# Patient Record
Sex: Male | Born: 1960 | Race: White | Hispanic: No | Smoking: Never smoker
Health system: Southern US, Community
[De-identification: ages and names within clinical notes are randomized; demographics above are authoritative.]

## PROBLEM LIST (undated history)

## (undated) DIAGNOSIS — L0291 Cutaneous abscess, unspecified: Secondary | ICD-10-CM

## (undated) DIAGNOSIS — I10 Essential (primary) hypertension: Secondary | ICD-10-CM

## (undated) DIAGNOSIS — I739 Peripheral vascular disease, unspecified: Secondary | ICD-10-CM

## (undated) DIAGNOSIS — E785 Hyperlipidemia, unspecified: Secondary | ICD-10-CM

## (undated) DIAGNOSIS — E119 Type 2 diabetes mellitus without complications: Secondary | ICD-10-CM

## (undated) HISTORY — DX: Peripheral vascular disease, unspecified: I73.9

## (undated) HISTORY — DX: Essential (primary) hypertension: I10

## (undated) HISTORY — DX: Hyperlipidemia, unspecified: E78.5

---

## 2015-02-18 ENCOUNTER — Emergency Department (HOSPITAL_COMMUNITY)
Admission: EM | Admit: 2015-02-18 | Discharge: 2015-02-18 | Disposition: A | Discharge status: Home / Self Care | Payer: Self-pay | Attending: Emergency Medicine | Admitting: Emergency Medicine

## 2015-02-18 ENCOUNTER — Encounter (HOSPITAL_COMMUNITY): Payer: Self-pay | Admitting: Emergency Medicine

## 2015-02-18 DIAGNOSIS — E119 Type 2 diabetes mellitus without complications: Secondary | ICD-10-CM | POA: Insufficient documentation

## 2015-02-18 DIAGNOSIS — Z791 Long term (current) use of non-steroidal anti-inflammatories (NSAID): Secondary | ICD-10-CM | POA: Insufficient documentation

## 2015-02-18 DIAGNOSIS — L0231 Cutaneous abscess of buttock: Secondary | ICD-10-CM | POA: Insufficient documentation

## 2015-02-18 DIAGNOSIS — Z7982 Long term (current) use of aspirin: Secondary | ICD-10-CM | POA: Insufficient documentation

## 2015-02-18 DIAGNOSIS — Z79899 Other long term (current) drug therapy: Secondary | ICD-10-CM | POA: Insufficient documentation

## 2015-02-18 HISTORY — DX: Cutaneous abscess, unspecified: L02.91

## 2015-02-18 HISTORY — DX: Type 2 diabetes mellitus without complications: E11.9

## 2015-02-18 MED ORDER — HYDROCODONE-ACETAMINOPHEN 5-325 MG PO TABS: 1.0000 | ORAL_TABLET | ORAL | Status: DC | PRN

## 2015-02-18 MED ORDER — SULFAMETHOXAZOLE-TRIMETHOPRIM 800-160 MG PO TABS: 1.0000 | ORAL_TABLET | Freq: Two times a day (BID) | ORAL | Status: AC

## 2015-02-18 MED ORDER — LIDOCAINE HCL (PF) 1 % IJ SOLN
INTRAMUSCULAR | Status: AC
  Filled 2015-02-18: qty 10

## 2015-02-18 NOTE — ED Provider Notes (Signed)
CSN: [id number]     Arrival date & time 02/18/15  1005 History   First MD Initiated Contact with Patient 02/18/15 1028     Chief Complaint  Patient presents with  . Abscess     (Consider location/radiation/quality/duration/timing/severity/associated sxs/prior Treatment) Patient is a 54 y.o. male presenting with abscess. The history is provided by the patient.  Abscess Associated symptoms: no fever    Daniel Byrd is a 54 y.o. male presenting for evaluation of an abscess on his left buttock which has been present for the past week and slowly getting larger.  He has a history of frequent abscesses which usually resolve with warm compresses and time.  He has used boil eze additionally without any resolution of this infection.  He denies fevers, chills, nausea and other complaint.       Past Medical History  Diagnosis Date  . Abscess   . Diabetes mellitus without complication    History reviewed. No pertinent past surgical history. Family History  Problem Relation Age of Onset  . Stroke Mother   . Diabetes Mother   . Heart failure Father    History  Substance Use Topics  . Smoking status: Never Smoker   . Smokeless tobacco: Never Used  . Alcohol Use: No    Review of Systems  Constitutional: Negative for fever and chills.  Respiratory: Negative for shortness of breath and wheezing.   Skin: Positive for color change.  Neurological: Negative for numbness.      Allergies  Review of patient's allergies indicates no known allergies.  Home Medications   Prior to Admission medications   Medication Sig Start Date End Date Taking? Authorizing Provider  aspirin EC 81 MG tablet Take 81 mg by mouth daily.   Yes Historical Provider, MD  naproxen sodium (ANAPROX) 220 MG tablet Take 220 mg by mouth 2 (two) times daily with a meal.   Yes Historical Provider, MD  ranitidine (ZANTAC) 150 MG tablet Take 150 mg by mouth 2 (two) times daily.   Yes Historical Provider, MD   HYDROcodone-acetaminophen (NORCO/VICODIN) 5-325 MG per tablet Take 1 tablet by mouth every 4 (four) hours as needed. 02/18/15   Burgess AmorJulie Marlowe Cinquemani, PA-C  sulfamethoxazole-trimethoprim (BACTRIM DS,SEPTRA DS) 800-160 MG per tablet Take 1 tablet by mouth 2 (two) times daily. 02/18/15 02/25/15  Raynelle FanningJulie Ajwa Kimberley, PA-C   BP 140/90 mmHg  Pulse 90  Temp(Src) 98.5 F (36.9 C) (Oral)  Resp 14  Ht 5\' 9"  (1.753 m)  Wt 185 lb (83.915 kg)  BMI 27.31 kg/m2  SpO2 100% Physical Exam  Constitutional: He appears well-developed and well-nourished. No distress.  HENT:  Head: Normocephalic.  Neck: Neck supple.  Cardiovascular: Normal rate.   Pulmonary/Chest: Effort normal. He has no wheezes.  Musculoskeletal: Normal range of motion. He exhibits no edema.  Skin:  Indurated abscess with central dark appearing eschar left lower medial buttock.  No drainage, small hallow of surrounding erythema, no red streaking.  No fluctuance.  Multiple areas of scarring on buttocks from prior abscesses.    ED Course  Procedures (including critical care time)  INCISION AND DRAINAGE Performed by: Burgess AmorIDOL, Dailee Manalang Consent: Verbal consent obtained. Risks and benefits: risks, benefits and alternatives were discussed Type: abscess  Body area: left buttock  Anesthesia: local infiltration  Incision was made with a scalpel.  Local anesthetic: lidocaine 1% without epinephrine  Anesthetic total: 6 ml  Complexity: complex Blunt dissection to break up loculations  Drainage: purulent  Drainage amount: small amount, approx 0.5 cc  Packing material: none  Patient tolerance: Patient tolerated the procedure well with no immediate complications.    Labs Review Labs Reviewed  CULTURE, ROUTINE-ABSCESS    Imaging Review No results found.   EKG Interpretation None      MDM   Final diagnoses:  Abscess of buttock, left    Pt advised warm water /shower soaks, bactrim, hydrocodone prescribed.  Chlorhexidine wash recommended.   Abscess culture pending.  Referral given for establishing pcp, otherwise advised return here for recheck for any worsened sx.    Pt seen by Dr. Jodi Mourning prior to dc home.    Burgess Amor, PA-C 02/18/15 1528  Blane Ohara, MD 02/19/15 [phone]

## 2015-02-18 NOTE — Discharge Instructions (Signed)
Abscess Care After An abscess (also called a boil or furuncle) is an infected area that contains a collection of pus. Signs and symptoms of an abscess include pain, tenderness, redness, or hardness, or you may feel a moveable soft area under your skin. An abscess can occur anywhere in the body. The infection may spread to surrounding tissues causing cellulitis. A cut (incision) by the surgeon was made over your abscess and the pus was drained out. Gauze may have been packed into the space to provide a drain that will allow the cavity to heal from the inside outwards. The boil may be painful for 5 to 7 days. Most people with a boil do not have high fevers. Your abscess, if seen early, may not have localized, and may not have been lanced. If not, another appointment may be required for this if it does not get better on its own or with medications. HOME CARE INSTRUCTIONS   Only take over-the-counter or prescription medicines for pain, discomfort, or fever as directed by your caregiver.  Soak in a warm tub of epsom salt water twice daily for 15-20 minutes.  Warm shower with directed water flow using your handheld shower head as discussed will also be therapeutic and help this heal quicker.  Use mild soap and water wash to the site.  Consider the chlorhexidine wash as recommended.  Do not drive within 4 hours of taking the pain medicine as it will make you sleepy.  Take your entire course of antibiotic. SEEK IMMEDIATE MEDICAL CARE IF:   You develop increased pain, swelling, redness, drainage, or bleeding in the wound site.  You develop signs of generalized infection including muscle aches, chills, fever, or a general ill feeling.  An oral temperature above 102 F (38.9 C) develops, not controlled by medication. See your caregiver for a recheck if you develop any of the symptoms described above. If medications (antibiotics) were prescribed, take them as directed. Document Released: 05/03/2005 Document  Revised: 01/07/2012 Document Reviewed: 12/29/2007 [facility] Patient Information 2015 [place], [place]. This information is not intended to replace advice given to you by your health care provider. Make sure you discuss any questions you have with your health care provider.

## 2015-02-18 NOTE — ED Notes (Signed)
Pt reports boil to L buttock, onset 1 week ago.

## 2015-02-18 NOTE — ED Notes (Signed)
Pt made aware to return if symptoms worsen or if any life threatening symptoms occur.   

## 2015-02-21 ENCOUNTER — Telehealth (HOSPITAL_BASED_OUTPATIENT_CLINIC_OR_DEPARTMENT_OTHER): Payer: Self-pay | Admitting: Emergency Medicine

## 2015-02-21 LAB — CULTURE, ROUTINE-ABSCESS: GRAM STAIN: NONE SEEN

## 2015-02-22 ENCOUNTER — Telehealth (HOSPITAL_COMMUNITY): Payer: Self-pay | Admitting: *Deleted

## 2015-03-09 ENCOUNTER — Telehealth (HOSPITAL_BASED_OUTPATIENT_CLINIC_OR_DEPARTMENT_OTHER): Payer: Self-pay | Admitting: Emergency Medicine

## 2015-03-09 NOTE — Telephone Encounter (Signed)
Lost to followup 

## 2016-07-05 ENCOUNTER — Emergency Department (HOSPITAL_COMMUNITY)
Admission: EM | Admit: 2016-07-05 | Discharge: 2016-07-05 | Disposition: A | Discharge status: Home / Self Care | Payer: Self-pay | Attending: Emergency Medicine | Admitting: Emergency Medicine

## 2016-07-05 ENCOUNTER — Encounter (HOSPITAL_COMMUNITY): Payer: Self-pay | Admitting: Emergency Medicine

## 2016-07-05 ENCOUNTER — Emergency Department (HOSPITAL_COMMUNITY): Payer: Self-pay

## 2016-07-05 DIAGNOSIS — I8002 Phlebitis and thrombophlebitis of superficial vessels of left lower extremity: Secondary | ICD-10-CM | POA: Insufficient documentation

## 2016-07-05 DIAGNOSIS — E119 Type 2 diabetes mellitus without complications: Secondary | ICD-10-CM | POA: Insufficient documentation

## 2016-07-05 DIAGNOSIS — Z7982 Long term (current) use of aspirin: Secondary | ICD-10-CM | POA: Insufficient documentation

## 2016-07-05 MED ORDER — CEPHALEXIN 500 MG PO CAPS: 500.0000 mg | ORAL_CAPSULE | Freq: Three times a day (TID) | ORAL | 0 refills | Status: DC

## 2016-07-05 MED ORDER — NAPROXEN 500 MG PO TABS: 500.0000 mg | ORAL_TABLET | Freq: Two times a day (BID) | ORAL | 0 refills | Status: DC

## 2016-07-05 NOTE — Discharge Instructions (Signed)
Elevate your leg when possible.  Warm compresses on/off.  Contact the clinic listed to establish primary care.  Return to ER for any worsening symptoms

## 2016-07-05 NOTE — ED Triage Notes (Signed)
PT c/o painful area to left inner mid-leg starting yesterday with redness traveling upward and hot to touch. PT denies any fevers at this time.

## 2016-07-05 NOTE — ED Provider Notes (Signed)
[facility]-EMERGENCY DEPT Provider Note   CSN: [id number] Arrival date & time: 07/05/16  1448     History   Chief Complaint Chief Complaint  Patient presents with  . Cellulitis    HPI Daniel Byrd is a 55 y.o. male.  HPI   Daniel Byrd is a 55 y.o. male who presents to the Emergency Department complaining of pain, redness to the medial left leg.  Noticed symptoms yesterday. Developed a "red streak" earlier today.  Pain is worse with palpation and weight bearing.  He denies known injury, insect bite or wound to the leg, chest pain, shortness of breath.  Hx of DM but not currently taking medications.  No hx of PE. DVT   Past Medical History:  Diagnosis Date  . Abscess   . Diabetes mellitus without complication (HCC)     There are no active problems to display for this patient.   History reviewed. No pertinent surgical history.     Home Medications    Prior to Admission medications   Medication Sig Start Date End Date Taking? Authorizing Provider  aspirin EC 81 MG tablet Take 81 mg by mouth daily.    Historical Provider, MD  HYDROcodone-acetaminophen (NORCO/VICODIN) 5-325 MG per tablet Take 1 tablet by mouth every 4 (four) hours as needed. 02/18/15   Burgess Amor, PA-C  naproxen sodium (ANAPROX) 220 MG tablet Take 220 mg by mouth 2 (two) times daily with a meal.    Historical Provider, MD  ranitidine (ZANTAC) 150 MG tablet Take 150 mg by mouth 2 (two) times daily.    Historical Provider, MD    Family History Family History  Problem Relation Age of Onset  . Stroke Mother   . Diabetes Mother   . Heart failure Father     Social History Social History  Substance Use Topics  . Smoking status: Never Smoker  . Smokeless tobacco: Never Used  . Alcohol use No     Allergies   Review of patient's allergies indicates no known allergies.   Review of Systems Review of Systems  Constitutional: Negative for activity change, appetite change, chills and fever.    Respiratory: Negative for chest tightness, shortness of breath and wheezing.   Cardiovascular: Negative for chest pain.  Gastrointestinal: Negative for abdominal pain, nausea and vomiting.  Genitourinary: Negative for dysuria.  Musculoskeletal: Positive for myalgias. Negative for arthralgias, back pain, joint swelling, neck pain and neck stiffness.  Skin: Positive for color change (redness medial left knee). Negative for rash and wound.  Neurological: Negative for dizziness, weakness, numbness and headaches.  Hematological: Does not bruise/bleed easily.  All other systems reviewed and are negative.    Physical Exam Updated Vital Signs BP 178/97 (BP Location: Left Arm)   Pulse 77   Temp 99.2 F (37.3 C) (Oral)   Resp 19   Ht 5\' 9"  (1.753 m)   Wt 79.4 kg   SpO2 100%   BMI 25.84 kg/m   Physical Exam  Constitutional: He is oriented to person, place, and time. He appears well-developed and well-nourished. No distress.  Cardiovascular: Normal rate, regular rhythm and intact distal pulses.   Pulmonary/Chest: Effort normal and breath sounds normal. No respiratory distress.  Musculoskeletal: Normal range of motion. He exhibits tenderness.       Left knee: He exhibits erythema. He exhibits normal range of motion.       Legs: Focal area of erythema to the skin of the medial left knee.  Palpable cord present extending to the  medial thigh.  DP pulses brisk, CR< 2 sec  Neurological: He is alert and oriented to person, place, and time.  Skin: Skin is warm and dry.  Psychiatric: He has a normal mood and affect.  Nursing note and vitals reviewed.    ED Treatments / Results  Labs (all labs ordered are listed, but only abnormal results are displayed) Labs Reviewed - No data to display  EKG  EKG Interpretation None       Radiology [place] Venous Img Lower Unilateral Left  Result Date: 07/05/2016 CLINICAL DATA:  Left lower extremity pain and edema. History of smoking. Evaluate for DVT  EXAM: LEFT LOWER EXTREMITY VENOUS DOPPLER ULTRASOUND TECHNIQUE: Gray-scale sonography with graded compression, as well as color Doppler and duplex ultrasound were performed to evaluate the lower extremity deep venous systems from the level of the common femoral vein and including the common femoral, femoral, profunda femoral, popliteal and calf veins including the posterior tibial, peroneal and gastrocnemius veins when visible. The superficial great saphenous vein was also interrogated. Spectral Doppler was utilized to evaluate flow at rest and with distal augmentation maneuvers in the common femoral, femoral and popliteal veins. COMPARISON:  None. FINDINGS: Contralateral Common Femoral Vein: Respiratory phasicity is normal and symmetric with the symptomatic side. No evidence of thrombus. Normal compressibility. Common Femoral Vein: No evidence of thrombus. Normal compressibility, respiratory phasicity and response to augmentation. Saphenofemoral Junction: No evidence of thrombus. Normal compressibility and flow on color Doppler imaging. Profunda Femoral Vein: No evidence of thrombus. Normal compressibility and flow on color Doppler imaging. Femoral Vein: No evidence of thrombus. Normal compressibility, respiratory phasicity and response to augmentation. Popliteal Vein: No evidence of thrombus. Normal compressibility, respiratory phasicity and response to augmentation. Calf Veins: No evidence of thrombus. Normal compressibility and flow on color Doppler imaging. Superficial Great Saphenous Vein: No evidence of thrombus. Normal compressibility and flow on color Doppler imaging. Venous Reflux:  None. Other Findings:  None. IMPRESSION: No evidence of DVT within the left lower extremity. Electronically Signed   By: Simonne ComeJohn  Watts M.D.   On: 07/05/2016 17:34    Procedures Procedures (including critical care time)  Medications Ordered in ED Medications - No data to display   Initial Impression / Assessment and Plan  / ED Course  I have reviewed the triage vital signs and the nursing notes.  Pertinent labs & imaging results that were available during my care of the patient were reviewed by me and considered in my medical decision making (see chart for details).  Clinical Course   1600  Pt also seen by Dr. Adriana Simasook.  Care plan discussed.    Sx's appear c/w superficial thrombophlebitis.  NV intact.    S neg for DVT.  Will treat with keflex and NSAID.  Strict return precautions given.  Pt is ambulatory with steady gait.   Final Clinical Impressions(s) / ED Diagnoses   Final diagnoses:  Superficial phlebitis and thrombophlebitis of left leg    New Prescriptions New Prescriptions   No medications on file     Rosey Bathammy Saleha Kalp, PA-C 07/06/16 2332    Donnetta HutchingBrian Cook, MD 07/09/16 [phone]

## 2020-12-21 ENCOUNTER — Encounter (HOSPITAL_COMMUNITY): Payer: Self-pay

## 2020-12-21 ENCOUNTER — Emergency Department (HOSPITAL_COMMUNITY): Payer: Medicare Other

## 2020-12-21 ENCOUNTER — Emergency Department (HOSPITAL_COMMUNITY)
Admission: EM | Admit: 2020-12-21 | Discharge: 2020-12-22 | Disposition: A | Discharge status: Home / Self Care | Payer: Medicare Other | Attending: Emergency Medicine | Admitting: Emergency Medicine

## 2020-12-21 ENCOUNTER — Other Ambulatory Visit: Payer: Self-pay

## 2020-12-21 DIAGNOSIS — E119 Type 2 diabetes mellitus without complications: Secondary | ICD-10-CM | POA: Insufficient documentation

## 2020-12-21 DIAGNOSIS — R109 Unspecified abdominal pain: Secondary | ICD-10-CM | POA: Diagnosis not present

## 2020-12-21 DIAGNOSIS — I10 Essential (primary) hypertension: Secondary | ICD-10-CM | POA: Diagnosis present

## 2020-12-21 LAB — CBC WITH DIFFERENTIAL/PLATELET
Abs Immature Granulocytes: 0.09 10*3/uL — ABNORMAL HIGH (ref 0.00–0.07)
Basophils Absolute: 0.1 10*3/uL (ref 0.0–0.1)
Basophils Relative: 1 %
Eosinophils Absolute: 0.1 10*3/uL (ref 0.0–0.5)
Eosinophils Relative: 1 %
HCT: 49 % (ref 39.0–52.0)
Hemoglobin: 17 g/dL (ref 13.0–17.0)
Immature Granulocytes: 1 %
Lymphocytes Relative: 21 %
Lymphs Abs: 3.9 10*3/uL (ref 0.7–4.0)
MCH: 30.2 pg (ref 26.0–34.0)
MCHC: 34.7 g/dL (ref 30.0–36.0)
MCV: 87.2 fL (ref 80.0–100.0)
Monocytes Absolute: 0.9 10*3/uL (ref 0.1–1.0)
Monocytes Relative: 5 %
Neutro Abs: 13.1 10*3/uL — ABNORMAL HIGH (ref 1.7–7.7)
Neutrophils Relative %: 71 %
Platelets: 269 10*3/uL (ref 150–400)
RBC: 5.62 MIL/uL (ref 4.22–5.81)
RDW: 12.5 % (ref 11.5–15.5)
WBC: 18.1 10*3/uL — ABNORMAL HIGH (ref 4.0–10.5)
nRBC: 0 % (ref 0.0–0.2)

## 2020-12-21 LAB — COMPREHENSIVE METABOLIC PANEL
ALT: 14 U/L (ref 0–44)
AST: 14 U/L — ABNORMAL LOW (ref 15–41)
Albumin: 4.3 g/dL (ref 3.5–5.0)
Alkaline Phosphatase: 67 U/L (ref 38–126)
Anion gap: 11 (ref 5–15)
BUN: 16 mg/dL (ref 6–20)
CO2: 25 mmol/L (ref 22–32)
Calcium: 9.6 mg/dL (ref 8.9–10.3)
Chloride: 99 mmol/L (ref 98–111)
Creatinine, Ser: 0.88 mg/dL (ref 0.61–1.24)
GFR, Estimated: >60 mL/min (ref >60)
Glucose, Bld: 277 mg/dL — ABNORMAL HIGH (ref 70–99)
Potassium: 3.8 mmol/L (ref 3.5–5.1)
Sodium: 135 mmol/L (ref 135–145)
Total Bilirubin: 0.5 mg/dL (ref 0.3–1.2)
Total Protein: 8.5 g/dL — ABNORMAL HIGH (ref 6.5–8.1)

## 2020-12-21 LAB — TROPONIN I (HIGH SENSITIVITY): Troponin I (High Sensitivity): 5 ng/L (ref <18)

## 2020-12-21 MED ORDER — HYDRALAZINE HCL 10 MG PO TABS
10.0000 mg | ORAL_TABLET | Freq: Once | ORAL | Status: AC
  Administered 2020-12-21: 10 mg via ORAL
  Filled 2020-12-21: qty 1

## 2020-12-21 MED ORDER — IOHEXOL 300 MG/ML  SOLN
100.0000 mL | Freq: Once | INTRAMUSCULAR | Status: AC | PRN
  Administered 2020-12-22: 100 mL via INTRAVENOUS

## 2020-12-21 NOTE — ED Triage Notes (Signed)
Pt to er, pt states that he is here for htn, states that he checked his blood pressure at home and it was elevated.  Pt states that he also has some R shoulder pain, states that he has frequent neck and shoulder pain and stiffness.  Pt states that he hasn't been to a doctor in 20-30 years.

## 2020-12-21 NOTE — ED Provider Notes (Signed)
Care assumed from Dr. Wilkie Aye, patient presenting with elevated blood pressure, vague flank pain and elevated WBC.  He is pending second troponin and CT abdomen and pelvis.  He will need medication for his blood pressure.  Repeat troponin is normal.  CT of abdomen and pelvis shows no acute process.  It is noted that he is also hyperglycemic with glucose 277.  He will need medication for both his blood pressure and glucose.  He is discharged with prescriptions for hydrochlorothiazide and Metformin.  He states he is planning to become established with [facility].  He is to make an appointment to be seen as soon as possible.  Recommended he stay on a low-salt diet and monitor his blood pressure at home.  Results for orders placed or performed during the hospital encounter of 12/21/20  CBC with Differential  Result Value Ref Range   WBC 18.1 (H) 4.0 - 10.5 K/uL   RBC 5.62 4.22 - 5.81 MIL/uL   Hemoglobin 17.0 13.0 - 17.0 g/dL   HCT 69.6 78.9 - 38.1 %   MCV 87.2 80.0 - 100.0 fL   MCH 30.2 26.0 - 34.0 pg   MCHC 34.7 30.0 - 36.0 g/dL   RDW 01.7 51.0 - 25.8 %   Platelets 269 150 - 400 K/uL   nRBC 0.0 0.0 - 0.2 %   Neutrophils Relative % 71 %   Neutro Abs 13.1 (H) 1.7 - 7.7 K/uL   Lymphocytes Relative 21 %   Lymphs Abs 3.9 0.7 - 4.0 K/uL   Monocytes Relative 5 %   Monocytes Absolute 0.9 0.1 - 1.0 K/uL   Eosinophils Relative 1 %   Eosinophils Absolute 0.1 0.0 - 0.5 K/uL   Basophils Relative 1 %   Basophils Absolute 0.1 0.0 - 0.1 K/uL   Immature Granulocytes 1 %   Abs Immature Granulocytes 0.09 (H) 0.00 - 0.07 K/uL  Comprehensive metabolic panel  Result Value Ref Range   Sodium 135 135 - 145 mmol/L   Potassium 3.8 3.5 - 5.1 mmol/L   Chloride 99 98 - 111 mmol/L   CO2 25 22 - 32 mmol/L   Glucose, Bld 277 (H) 70 - 99 mg/dL   BUN 16 6 - 20 mg/dL   Creatinine, Ser 5.27 0.61 - 1.24 mg/dL   Calcium 9.6 8.9 - 78.2 mg/dL   Total Protein 8.5 (H) 6.5 - 8.1 g/dL   Albumin 4.3 3.5 - 5.0  g/dL   AST 14 (L) 15 - 41 U/L   ALT 14 0 - 44 U/L   Alkaline Phosphatase 67 38 - 126 U/L   Total Bilirubin 0.5 0.3 - 1.2 mg/dL   GFR, Estimated >42 >35 mL/min   Anion gap 11 5 - 15  Troponin I (High Sensitivity)  Result Value Ref Range   Troponin I (High Sensitivity) 5 <18 ng/L  Troponin I (High Sensitivity)  Result Value Ref Range   Troponin I (High Sensitivity) 5 <18 ng/L   CT Abdomen Pelvis W Contrast  Result Date: 12/22/2020 CLINICAL DATA:  Abdominal pain EXAM: CT ABDOMEN AND PELVIS WITH CONTRAST TECHNIQUE: Multidetector CT imaging of the abdomen and pelvis was performed using the standard protocol following bolus administration of intravenous contrast. CONTRAST:  OMNIPAQUE IOHEXOL 300 MG/ML  SOLN COMPARISON:  None. FINDINGS: LOWER CHEST: Normal. HEPATOBILIARY: Normal hepatic contours. No intra- or extrahepatic biliary dilatation. The gallbladder is normal. PANCREAS: Normal pancreas. No ductal dilatation or peripancreatic fluid collection. SPLEEN: Normal. ADRENALS/URINARY TRACT: The adrenal glands are  normal. No hydronephrosis, nephroureterolithiasis or solid renal mass. The urinary bladder is normal for degree of distention STOMACH/BOWEL: There is no hiatal hernia. Normal duodenal course and caliber. No small bowel dilatation or inflammation. No focal colonic abnormality. Normal appendix. VASCULAR/LYMPHATIC: There is calcific atherosclerosis of the abdominal aorta. No lymphadenopathy. REPRODUCTIVE: Enlarged prostate measures 6.3 cm in transverse dimension. MUSCULOSKELETAL. No bony spinal canal stenosis or focal osseous abnormality. OTHER: None. IMPRESSION: 1. No acute abnormality of the abdomen or pelvis. 2. Prostatomegaly. Aortic Atherosclerosis (ICD10-I70.0). Electronically Signed   By: Deatra Robinson M.D.   On: 12/22/2020 00:38   DG Chest Port 1 View  Result Date: 12/22/2020 CLINICAL DATA:  Hypertension. EXAM: PORTABLE CHEST 1 VIEW COMPARISON:  None. FINDINGS: The heart size and  mediastinal contours are within normal limits. Both lungs are clear. The visualized skeletal structures are unremarkable. IMPRESSION: No active disease. Electronically Signed   By: Aram Candela M.D.   On: 12/22/2020 00:17     Dione Booze, MD 12/22/20 [phone]

## 2020-12-21 NOTE — ED Provider Notes (Signed)
[facility] EMERGENCY DEPARTMENT Provider Note   CSN: [id number] Arrival date & time: 12/21/20  1757     History Chief Complaint  Patient presents with  . Hypertension    Daniel Byrd is a 60 y.o. male.  HPI   60 year old male presents the emergency department concern for high blood pressure.  Patient states for the past couple years he has had an elevated blood pressure usually with systolics around 1 60-1 70.  He refuses to go to a doctor because he is scared to.  He has never been appropriately treated for HTN.  Patient states today when he checked his blood pressure the top number was over 200 and he got scared which is what prompted him to come to the emergency department for evaluation.  He currently has no chest pain or shortness of breath.  He reveals that yesterday he felt like he had some right jaw and shoulder pain but this is currently absent.  He also reveals that he is been having right-sided abdominal/flank discomfort for many years, as it is an achy sore sensation, sometimes keeps him up at night due to pain.  He has no associated nausea/vomiting/diarrhea.  Past Medical History:  Diagnosis Date  . Abscess   . Diabetes mellitus without complication (HCC)     There are no problems to display for this patient.   History reviewed. No pertinent surgical history.     Family History  Problem Relation Age of Onset  . Stroke Mother   . Diabetes Mother   . Heart failure Father     Social History   Tobacco Use  . Smoking status: Never Smoker  . Smokeless tobacco: Never Used  Vaping Use  . Vaping Use: Never used  Substance Use Topics  . Alcohol use: No  . Drug use: Yes    Types: Marijuana    Home Medications Prior to Admission medications   Medication Sig Start Date End Date Taking? Authorizing Provider  cephALEXin (KEFLEX) 500 MG capsule Take 1 capsule (500 mg total) by mouth 3 (three) times daily. For 7 days 07/05/16   Pauline Aus, PA-C  Multiple  Vitamin (MULTIVITAMIN WITH MINERALS) TABS tablet Take 1 tablet by mouth daily.    [provider]  naproxen (NAPROSYN) 500 MG tablet Take 1 tablet (500 mg total) by mouth 2 (two) times daily with a meal. 07/05/16   Triplett, Tammy, PA-C    Allergies    Patient has no known allergies.  Review of Systems   Review of Systems  Constitutional: Negative for chills and fever.  HENT: Negative for congestion.   Eyes: Negative for visual disturbance.  Respiratory: Negative for shortness of breath.   Cardiovascular: Negative for chest pain.       + HTN  Gastrointestinal: Negative for abdominal pain, diarrhea and vomiting.  Genitourinary: Positive for flank pain. Negative for dysuria.  Musculoskeletal:       + Right shoulder and jaw pain  Skin: Negative for rash.  Neurological: Negative for headaches.    Physical Exam Updated Vital Signs BP (!) 177/102   Pulse 77   Temp 99.2 F (37.3 C) (Oral)   Resp 15   Ht 5\' 9"  (1.753 m)   Wt 83.6 kg   SpO2 100%   BMI 27.20 kg/m   Physical Exam Vitals and nursing note reviewed.  Constitutional:      Appearance: Normal appearance.  HENT:     Head: Normocephalic.     Mouth/Throat:  Mouth: Mucous membranes are moist.  Cardiovascular:     Rate and Rhythm: Normal rate.  Pulmonary:     Effort: Pulmonary effort is normal. No respiratory distress.  Abdominal:     Palpations: Abdomen is soft. There is no mass.     Tenderness: There is no abdominal tenderness. There is no guarding or rebound.     Hernia: No hernia is present.  Skin:    General: Skin is warm.  Neurological:     Mental Status: He is alert and oriented to person, place, and time. Mental status is at baseline.  Psychiatric:        Mood and Affect: Mood normal.     ED Results / Procedures / Treatments   Labs (all labs ordered are listed, but only abnormal results are displayed) Labs Reviewed  CBC WITH DIFFERENTIAL/PLATELET  COMPREHENSIVE METABOLIC PANEL  TROPONIN  I (HIGH SENSITIVITY)    EKG EKG Interpretation  Date/Time:  Wednesday December 21 2020 21:06:36 EST Ventricular Rate:  77 PR Interval:    QRS Duration: 143 QT Interval:  419 QTC Calculation: 475 R Axis:   70 Text Interpretation: Sinus rhythm Right bundle branch block NSR, RBBB, no previous for comparison Confirmed by Coralee Pesa [phone]) on 12/21/2020 9:25:04 PM   Radiology No results found.  Procedures Procedures   Medications Ordered in ED Medications  hydrALAZINE (APRESOLINE) tablet 10 mg (has no administration in time range)    ED Course  I have reviewed the triage vital signs and the nursing notes.  Pertinent labs & imaging results that were available during my care of the patient were reviewed by me and considered in my medical decision making (see chart for details).    MDM Rules/Calculators/A&P                          60 year old male presents the emergency department with HTN.  Arrival blood pressures had systolics over the 200s.  He has no active chest pain, shortness of breath or jaw pain.  EKG shows a right bundle branch block, no previous to compare to.  No shortness of breath/tachycardia/hypoxia in regards to a PE.  Plan to treat hypertension, evaluate with blood work and a chest x-ray as well as do a CAT scan of the abdomen, given his concerning flank pain to rule out something like a AAA. Patient signed out to Dr. Preston Fleeting pending results and re evaluation.  Final Clinical Impression(s) / ED Diagnoses Final diagnoses:  None    Rx / DC Orders ED Discharge Orders    None       Rozelle Logan, DO 12/21/20 2253

## 2020-12-22 LAB — TROPONIN I (HIGH SENSITIVITY): Troponin I (High Sensitivity): 5 ng/L (ref <18)

## 2020-12-22 MED ORDER — METFORMIN HCL 500 MG PO TABS
500.0000 mg | ORAL_TABLET | Freq: Once | ORAL | Status: AC
  Administered 2020-12-22: 500 mg via ORAL
  Filled 2020-12-22: qty 1

## 2020-12-22 MED ORDER — METFORMIN HCL 500 MG PO TABS: 500.0000 mg | ORAL_TABLET | Freq: Two times a day (BID) | ORAL | 0 refills | Status: DC

## 2020-12-22 MED ORDER — HYDROCHLOROTHIAZIDE 25 MG PO TABS: 25.0000 mg | ORAL_TABLET | Freq: Every day | ORAL | 0 refills | Status: DC

## 2020-12-22 NOTE — Discharge Instructions (Signed)
Please start taking your blood pressure every day.  Keep a record of it and take that record with you when you see your primary care provider.  Be aware, that it may take several weeks before you see the effects of the blood pressure medication.  Return if you are having any problems.

## 2021-02-17 ENCOUNTER — Ambulatory Visit: Payer: Medicare Other | Admitting: Urology

## 2021-06-15 ENCOUNTER — Other Ambulatory Visit: Payer: Self-pay

## 2021-06-15 ENCOUNTER — Emergency Department (HOSPITAL_COMMUNITY)
Admission: EM | Admit: 2021-06-15 | Discharge: 2021-06-15 | Disposition: A | Discharge status: Home / Self Care | Payer: Medicare Other

## 2021-07-25 ENCOUNTER — Inpatient Hospital Stay (HOSPITAL_COMMUNITY)
Admission: EM | Admit: 2021-07-25 | Discharge: 2021-07-28 | DRG: 253 | Disposition: A | Discharge status: Home / Self Care | Payer: Medicare Other | Attending: Internal Medicine | Admitting: Internal Medicine

## 2021-07-25 ENCOUNTER — Emergency Department (HOSPITAL_COMMUNITY): Payer: Medicare Other

## 2021-07-25 ENCOUNTER — Other Ambulatory Visit: Payer: Self-pay

## 2021-07-25 ENCOUNTER — Emergency Department (HOSPITAL_COMMUNITY)
Admit: 2021-07-25 | Discharge: 2021-07-25 | Disposition: A | Discharge status: Home / Self Care | Payer: Medicare Other | Attending: Emergency Medicine | Admitting: Emergency Medicine

## 2021-07-25 ENCOUNTER — Emergency Department (HOSPITAL_COMMUNITY): Admit: 2021-07-25 | Discharge: 2021-07-25 | Disposition: A | Discharge status: Home / Self Care | Payer: Medicare Other

## 2021-07-25 ENCOUNTER — Encounter (HOSPITAL_COMMUNITY): Payer: Self-pay | Admitting: *Deleted

## 2021-07-25 ENCOUNTER — Inpatient Hospital Stay (HOSPITAL_COMMUNITY): Admit: 2021-07-25 | Payer: Medicare Other

## 2021-07-25 DIAGNOSIS — R008 Other abnormalities of heart beat: Secondary | ICD-10-CM | POA: Diagnosis not present

## 2021-07-25 DIAGNOSIS — Z79899 Other long term (current) drug therapy: Secondary | ICD-10-CM

## 2021-07-25 DIAGNOSIS — I70221 Atherosclerosis of native arteries of extremities with rest pain, right leg: Secondary | ICD-10-CM | POA: Diagnosis present

## 2021-07-25 DIAGNOSIS — I739 Peripheral vascular disease, unspecified: Secondary | ICD-10-CM

## 2021-07-25 DIAGNOSIS — M79604 Pain in right leg: Secondary | ICD-10-CM

## 2021-07-25 DIAGNOSIS — R609 Edema, unspecified: Secondary | ICD-10-CM | POA: Diagnosis not present

## 2021-07-25 DIAGNOSIS — L0390 Cellulitis, unspecified: Secondary | ICD-10-CM | POA: Diagnosis not present

## 2021-07-25 DIAGNOSIS — I70229 Atherosclerosis of native arteries of extremities with rest pain, unspecified extremity: Secondary | ICD-10-CM | POA: Diagnosis not present

## 2021-07-25 DIAGNOSIS — Z8249 Family history of ischemic heart disease and other diseases of the circulatory system: Secondary | ICD-10-CM | POA: Diagnosis not present

## 2021-07-25 DIAGNOSIS — I4510 Unspecified right bundle-branch block: Secondary | ICD-10-CM | POA: Diagnosis present

## 2021-07-25 DIAGNOSIS — E1151 Type 2 diabetes mellitus with diabetic peripheral angiopathy without gangrene: Principal | ICD-10-CM | POA: Diagnosis present

## 2021-07-25 DIAGNOSIS — Z833 Family history of diabetes mellitus: Secondary | ICD-10-CM | POA: Diagnosis not present

## 2021-07-25 DIAGNOSIS — M86171 Other acute osteomyelitis, right ankle and foot: Secondary | ICD-10-CM

## 2021-07-25 DIAGNOSIS — Z7984 Long term (current) use of oral hypoglycemic drugs: Secondary | ICD-10-CM | POA: Diagnosis not present

## 2021-07-25 DIAGNOSIS — Z20822 Contact with and (suspected) exposure to covid-19: Secondary | ICD-10-CM | POA: Diagnosis present

## 2021-07-25 DIAGNOSIS — N179 Acute kidney failure, unspecified: Secondary | ICD-10-CM | POA: Diagnosis present

## 2021-07-25 DIAGNOSIS — L03115 Cellulitis of right lower limb: Secondary | ICD-10-CM | POA: Diagnosis present

## 2021-07-25 DIAGNOSIS — I10 Essential (primary) hypertension: Secondary | ICD-10-CM | POA: Diagnosis present

## 2021-07-25 DIAGNOSIS — Z823 Family history of stroke: Secondary | ICD-10-CM

## 2021-07-25 DIAGNOSIS — E119 Type 2 diabetes mellitus without complications: Secondary | ICD-10-CM

## 2021-07-25 LAB — CBC
HCT: 48.9 % (ref 39.0–52.0)
Hemoglobin: 16.6 g/dL (ref 13.0–17.0)
MCH: 30.6 pg (ref 26.0–34.0)
MCHC: 33.9 g/dL (ref 30.0–36.0)
MCV: 90.1 fL (ref 80.0–100.0)
Platelets: 201 10*3/uL (ref 150–400)
RBC: 5.43 MIL/uL (ref 4.22–5.81)
RDW: 13.3 % (ref 11.5–15.5)
WBC: 12.5 10*3/uL — ABNORMAL HIGH (ref 4.0–10.5)
nRBC: 0 % (ref 0.0–0.2)

## 2021-07-25 LAB — BASIC METABOLIC PANEL
Anion gap: 9 (ref 5–15)
BUN: 25 mg/dL — ABNORMAL HIGH (ref 8–23)
CO2: 24 mmol/L (ref 22–32)
Calcium: 9.5 mg/dL (ref 8.9–10.3)
Chloride: 106 mmol/L (ref 98–111)
Creatinine, Ser: 0.98 mg/dL (ref 0.61–1.24)
GFR, Estimated: >60 mL/min (ref >60)
Glucose, Bld: 277 mg/dL — ABNORMAL HIGH (ref 70–99)
Potassium: 4.4 mmol/L (ref 3.5–5.1)
Sodium: 139 mmol/L (ref 135–145)

## 2021-07-25 LAB — LIPID PANEL
Cholesterol: 201 mg/dL — ABNORMAL HIGH (ref 0–200)
HDL: 41 mg/dL (ref >40)
LDL Cholesterol: 146 mg/dL — ABNORMAL HIGH (ref 0–99)
Total CHOL/HDL Ratio: 4.9 RATIO
Triglycerides: 68 mg/dL (ref <150)
VLDL: 14 mg/dL (ref 0–40)

## 2021-07-25 LAB — TROPONIN I (HIGH SENSITIVITY)
Troponin I (High Sensitivity): 4 ng/L (ref <18)
Troponin I (High Sensitivity): 5 ng/L (ref <18)

## 2021-07-25 LAB — CBG MONITORING, ED: Glucose-Capillary: 122 mg/dL — ABNORMAL HIGH (ref 70–99)

## 2021-07-25 LAB — RESP PANEL BY RT-PCR (FLU A&B, COVID) ARPGX2
Influenza A by PCR: NEGATIVE
Influenza B by PCR: NEGATIVE
SARS Coronavirus 2 by RT PCR: NEGATIVE

## 2021-07-25 MED ORDER — ONDANSETRON HCL 4 MG/2ML IJ SOLN
4.0000 mg | Freq: Once | INTRAMUSCULAR | Status: AC
  Administered 2021-07-25: 4 mg via INTRAVENOUS
  Filled 2021-07-25: qty 2

## 2021-07-25 MED ORDER — OXYCODONE-ACETAMINOPHEN 5-325 MG PO TABS
1.0000 | ORAL_TABLET | ORAL | Status: DC | PRN
Start: 2021-07-25 — End: 2021-07-25

## 2021-07-25 MED ORDER — PIPERACILLIN-TAZOBACTAM 3.375 G IVPB 30 MIN
3.3750 g | Freq: Once | INTRAVENOUS | Status: AC
  Administered 2021-07-25: 3.375 g via INTRAVENOUS
  Filled 2021-07-25: qty 50

## 2021-07-25 MED ORDER — INSULIN ASPART 100 UNIT/ML IJ SOLN: 0.0000 [IU] | Freq: Every day | INTRAMUSCULAR | Status: DC

## 2021-07-25 MED ORDER — VANCOMYCIN HCL 1500 MG/300ML IV SOLN
1500.0000 mg | Freq: Once | INTRAVENOUS | Status: AC
  Administered 2021-07-25: 1500 mg via INTRAVENOUS
  Filled 2021-07-25: qty 300

## 2021-07-25 MED ORDER — SODIUM CHLORIDE 0.9 % IV SOLN
2.0000 g | Freq: Three times a day (TID) | INTRAVENOUS | Status: DC
  Administered 2021-07-26: 2 g via INTRAVENOUS
  Filled 2021-07-25: qty 2

## 2021-07-25 MED ORDER — HYDROMORPHONE HCL 1 MG/ML IJ SOLN: 1.0000 mg | INTRAMUSCULAR | Status: DC | PRN

## 2021-07-25 MED ORDER — VANCOMYCIN HCL IN DEXTROSE 1-5 GM/200ML-% IV SOLN
1000.0000 mg | Freq: Two times a day (BID) | INTRAVENOUS | Status: DC
  Administered 2021-07-26: 1000 mg via INTRAVENOUS
  Filled 2021-07-25 (×2): qty 200

## 2021-07-25 MED ORDER — OXYCODONE HCL 5 MG PO TABS: 5.0000 mg | ORAL_TABLET | ORAL | Status: DC | PRN

## 2021-07-25 MED ORDER — OXYCODONE HCL 5 MG PO TABS
5.0000 mg | ORAL_TABLET | ORAL | Status: DC | PRN
  Administered 2021-07-26 (×2): 5 mg via ORAL
  Filled 2021-07-25 (×2): qty 1

## 2021-07-25 MED ORDER — INSULIN ASPART 100 UNIT/ML IJ SOLN
0.0000 [IU] | Freq: Three times a day (TID) | INTRAMUSCULAR | Status: DC
  Administered 2021-07-26: 1 [IU] via SUBCUTANEOUS
  Administered 2021-07-26 – 2021-07-27 (×4): 3 [IU] via SUBCUTANEOUS
  Administered 2021-07-28: 11 [IU] via SUBCUTANEOUS
  Administered 2021-07-28: 2 [IU] via SUBCUTANEOUS

## 2021-07-25 MED ORDER — HYDROMORPHONE HCL 1 MG/ML IJ SOLN: 0.5000 mg | INTRAMUSCULAR | Status: DC | PRN

## 2021-07-25 MED ORDER — ACETAMINOPHEN 650 MG RE SUPP: 650.0000 mg | Freq: Four times a day (QID) | RECTAL | Status: DC | PRN

## 2021-07-25 MED ORDER — HYDROMORPHONE HCL 1 MG/ML IJ SOLN
0.5000 mg | INTRAMUSCULAR | Status: DC | PRN
  Administered 2021-07-26 – 2021-07-27 (×2): 0.5 mg via INTRAVENOUS
  Filled 2021-07-25 (×2): qty 1

## 2021-07-25 MED ORDER — ACETAMINOPHEN 500 MG PO TABS
1000.0000 mg | ORAL_TABLET | Freq: Three times a day (TID) | ORAL | Status: DC
  Administered 2021-07-26 – 2021-07-28 (×6): 1000 mg via ORAL
  Filled 2021-07-25 (×7): qty 2

## 2021-07-25 MED ORDER — MORPHINE SULFATE (PF) 4 MG/ML IV SOLN
4.0000 mg | Freq: Once | INTRAVENOUS | Status: AC
  Administered 2021-07-25: 4 mg via INTRAVENOUS
  Filled 2021-07-25: qty 1

## 2021-07-25 MED ORDER — ACETAMINOPHEN 325 MG PO TABS
650.0000 mg | ORAL_TABLET | Freq: Four times a day (QID) | ORAL | Status: DC | PRN
Start: 2021-07-25 — End: 2021-07-25

## 2021-07-25 MED ORDER — ACETAMINOPHEN 650 MG RE SUPP: 650.0000 mg | Freq: Three times a day (TID) | RECTAL | Status: DC

## 2021-07-25 MED ORDER — LISINOPRIL 10 MG PO TABS
10.0000 mg | ORAL_TABLET | Freq: Every day | ORAL | Status: DC
  Administered 2021-07-26 – 2021-07-28 (×3): 10 mg via ORAL
  Filled 2021-07-25 (×3): qty 1

## 2021-07-25 NOTE — Progress Notes (Addendum)
Pharmacy Antibiotic Note  Daniel Byrd is a 60 y.o. male admitted on 07/25/2021 presenting with leg pain, chronic vascular disease, concern for cellulitis Pharmacy has been consulted for vancomycin dosing.  Plan: Vancomycin 1500 mg IV x 1, then 1000 mg IV q 12h  (eAUC 502, Goal AUC 400-550, SCr 0.98) Cefepime 2g IV q8h Monitor renal function, clinical progression and ability to narrow Vancomycin levels as needed  Height: 5\' 9"  (175.3 cm) Weight: 78.9 kg (174 lb) IBW/kg (Calculated) : 70.7  Temp (24hrs), Avg:98.3 F (36.8 C), Min:98.2 F (36.8 C), Max:98.3 F (36.8 C)  Recent Labs  Lab 07/25/21 1506  WBC 12.5*  CREATININE 0.98    Estimated Creatinine Clearance: 79.2 mL/min (by C-G formula based on SCr of 0.98 mg/dL).    No Known Allergies  07/27/21, PharmD Clinical Pharmacist ED Pharmacist Phone # [phone] 07/25/2021 6:39 PM

## 2021-07-25 NOTE — ED Triage Notes (Signed)
LEFT LEG PAIN AND SWELLING . WORSE AFTER VASCULAR STUDY LAST WEEK, STATES HE WAS ADVISED TO SEE A VASCULAR SURGEON

## 2021-07-25 NOTE — ED Provider Notes (Addendum)
[facility] EMERGENCY DEPARTMENT Provider Note   CSN: [id number] Arrival date & time: 07/25/21  1348     History No chief complaint on file.   Daniel Byrd is a 60 y.o. male who presents emergency department with a chief complaint of right leg pain.  Patient states that he has been having symptoms of claudication in the right leg for the past year.  He states that last summer when he would mow the lawn he would get a severe cramp in his right calf that would be relieved with rest.  For the past 3 to 4 months he has had chronic, daily leg pain which he describes as severe.  He is attended by his sister-in-law today.  Over the past 4 days he has developed swelling in his foot.  They were seen at [facility] by cardiologist and had arterial ultrasounds done which showed the following results with start which are listed below the HPI.  They were concerned that this might need emergent intervention although they were given vascular follow-up which is not for a few more weeks.  Pvl Arterial Physiologic Complete Lower Extremity Bilateral (07/20/2021 2:18 PM EDT) Imaging Results - Pvl Arterial Physiologic Complete Lower Extremity Bilateral (07/20/2021 2:18 PM EDT) Anatomical Region Laterality Modality  Vascular   Ultrasound   07/20/2021 2:57 PM EDT   Right:  Resting ABI on the right non attainable.  The segmental Doppler at the right ankle demonstrates evidence of significant more proximal arterial occlusive disease, with abnormal monophasic dorsalis pedis waveform.  Left:  Resting ABI on the left noncompressible  Segmental Doppler at the left ankle demonstrates waveforms relatively maintained.  Signed,  Yvone Neu. Reyne Dumas, RPVI  [facility]  [facility] Radiology   Electronically Signed  By: Gilmer Mor D.O.  On: 07/20/2021 14:57      Imaging Results - Pvl Arterial Physiologic Complete Lower Extremity Bilateral (07/20/2021 2:18 PM  EDT) Narrative  07/20/2021 2:57 PM EDT   CLINICAL DATA:  60 year old male with claudication  EXAM: NONINVASIVE PHYSIOLOGIC VASCULAR STUDY OF BILATERAL LOWER EXTREMITIES  TECHNIQUE: Non-invasive vascular evaluation of both lower extremities was performed at rest, including calculation of ankle-brachial indices, multiple segmental pressure evaluation, segmental Doppler and segmental pulse volume recording.  COMPARISON:  None.  FINDINGS: Right:  Resting ankle brachial index:  Non attainable  TBI: 0.12  Doppler: Segmental Doppler at the right ankle demonstrates artifact of the posterior tibial artery with abnormal monophasic waveform of the dorsalis pedis.  Left:  Resting ankle brachial index: Noncompressible  TBI: 0.49  Doppler: Segmental Doppler at the left ankle demonstrates triphasic dorsalis pedis and biphasic posterior tibial  Additional:       HPI     Past Medical History:  Diagnosis Date   Abscess    Diabetes mellitus without complication (HCC)     There are no problems to display for this patient.   History reviewed. No pertinent surgical history.     Family History  Problem Relation Age of Onset   Stroke Mother    Diabetes Mother    Heart failure Father     Social History   Tobacco Use   Smoking status: Never   Smokeless tobacco: Never  Vaping Use   Vaping Use: Never used  Substance Use Topics   Alcohol use: No   Drug use: Yes    Types: Marijuana    Home Medications Prior to Admission medications   Medication Sig Start Date End Date Taking? Authorizing  Provider  cephALEXin (KEFLEX) 500 MG capsule Take 1 capsule (500 mg total) by mouth 3 (three) times daily. For 7 days 07/05/16   Triplett, Tammy, PA-C  hydrochlorothiazide (HYDRODIURIL) 25 MG tablet Take 1 tablet (25 mg total) by mouth daily. 12/22/20   Dione Booze, MD  metFORMIN (GLUCOPHAGE) 500 MG tablet Take 1 tablet (500 mg total) by mouth 2 (two) times daily with a meal.  12/22/20   Dione Booze, MD  Multiple Vitamin (MULTIVITAMIN WITH MINERALS) TABS tablet Take 1 tablet by mouth daily.    [provider]  naproxen (NAPROSYN) 500 MG tablet Take 1 tablet (500 mg total) by mouth 2 (two) times daily with a meal. 07/05/16   Triplett, Tammy, PA-C    Allergies    Patient has no known allergies.  Review of Systems   Review of Systems Ten systems reviewed and are negative for acute change, except as noted in the HPI.   Physical Exam Updated Vital Signs BP 129/81 (BP Location: Right Arm)   Pulse 83   Temp 98.2 F (36.8 C) (Oral)   Resp 15   Ht 5\' 9"  (1.753 m)   Wt 78.9 kg   SpO2 99%   BMI 25.70 kg/m   Physical Exam Vitals and nursing note reviewed.  Constitutional:      General: He is not in acute distress.    Appearance: He is well-developed. He is not diaphoretic.  HENT:     Head: Normocephalic and atraumatic.  Eyes:     General: No scleral icterus.    Conjunctiva/sclera: Conjunctivae normal.  Cardiovascular:     Rate and Rhythm: Normal rate and regular rhythm.     Pulses:          Dorsalis pedis pulses are 0 on the right side and 1+ on the left side.       Posterior tibial pulses are 0 on the right side and 1+ on the left side.     Heart sounds: Normal heart sounds.  Pulmonary:     Effort: Pulmonary effort is normal. No respiratory distress.     Breath sounds: Normal breath sounds.  Abdominal:     Palpations: Abdomen is soft.     Tenderness: There is no abdominal tenderness.  Musculoskeletal:     Cervical back: Normal range of motion and neck supple.     Right lower leg: 1+ Pitting Edema present.     Left lower leg: No edema.  Skin:    General: Skin is warm and dry.  Neurological:     Mental Status: He is alert.  Psychiatric:        Behavior: Behavior normal.    ED Results / Procedures / Treatments   Labs (all labs ordered are listed, but only abnormal results are displayed) Labs Reviewed  BASIC METABOLIC PANEL -  Abnormal; Notable for the following components:      Result Value   Glucose, Bld 277 (*)    BUN 25 (*)    All other components within normal limits  CBC - Abnormal; Notable for the following components:   WBC 12.5 (*)    All other components within normal limits  RESP PANEL BY RT-PCR (FLU A&B, COVID) ARPGX2  TROPONIN I (HIGH SENSITIVITY)  TROPONIN I (HIGH SENSITIVITY)    EKG EKG Interpretation  Date/Time:  Tuesday July 25 2021 14:37:01 EDT Ventricular Rate:  77 PR Interval:  188 QRS Duration: 122 QT Interval:  400 QTC Calculation: 452 R Axis:   77  Text Interpretation: Normal sinus rhythm Right bundle branch block Abnormal ECG No significant change since last tracing Confirmed by Linwood Dibbles [phone]) on 07/25/2021 3:02:19 PM  Radiology DG Chest 2 View  Result Date: 07/25/2021 CLINICAL DATA:  Right-sided chest pain EXAM: CHEST - 2 VIEW COMPARISON:  12/21/2020 FINDINGS: The heart size and mediastinal contours are within normal limits. Both lungs are clear. The visualized skeletal structures are unremarkable. IMPRESSION: No active cardiopulmonary disease. Electronically Signed   By: Sharlet Salina M.D.   On: 07/25/2021 16:03    Procedures Procedures   Medications Ordered in ED Medications  morphine 4 MG/ML injection 4 mg (4 mg Intravenous Given 07/25/21 1519)  ondansetron (ZOFRAN) injection 4 mg (4 mg Intravenous Given 07/25/21 1519)    ED Course  I have reviewed the triage vital signs and the nursing notes.  Pertinent labs & imaging results that were available during my care of the patient were reviewed by me and considered in my medical decision making (see chart for details).    MDM Rules/Calculators/A&P                           60 year old male here with right leg pain which appears to be chronic but progressively worsening.  He does not have pulses by palpation or Doppler in the right foot.  I discussed the case with Dr. Edilia Bo who states that the patient does not  need any further intervention with immediate imaging at this time and that if he wants to be evaluated he could come to the [facility] emergency department.  Dr. Edilia Bo suspects that this is just worsening of chronic vascular disease.  The patient has color to the foot which is not suggestive of acute arterial occlusion.  We reviewed the findings of recent vascular studies done at [facility] which suggests chronic multilevel arterial vessel disease on the right side.  Patient was informed of findings and has opted to go for further evaluation at [facility] emergency department.  Dr. Edilia Bo has asked that he be called upon the patient's arrival so that he may assess him.  I reviewed labs ordered in triage which shows elevated white blood cell count likely acute phase reaction, elevated blood glucose patient has a history of diabetes initial troponin within normal limits.  I reviewed images of a 2 view chest x-ray which shows no acute abnormalities.  EKG shows sinus rhythm with a right bundle branch block at a rate of 77. Final Clinical Impression(s) / ED Diagnoses Final diagnoses:  None    Rx / DC Orders ED Discharge Orders     None        Arthor Captain, PA-C 07/25/21 1658    Arthor Captain, PA-C 07/25/21 1659    Linwood Dibbles, MD 07/27/21 [phone]

## 2021-07-25 NOTE — ED Provider Notes (Signed)
Emergency Medicine Provider Triage Evaluation Note  Daniel Byrd , a 60 y.o. male  was evaluated in triage.  Pt complains of right leg pain.  He was sent over here by Daniel Byrd for DVT evaluation.  He has had worsening leg pain and leg swelling for the past 4 days.  He has redness associated. He has a history of peripheral artery disease.  He was seen at [facility] where he had arterial ultrasounds done and patient was given vascular follow-up.  He has not yet seen vascular yet. Per Daniel Byrd note, he does not have pulses by palpation or Doppler in the right foot.  Review of Systems  Positive: Unilateral leg swelling, leg pain, erythema Negative: Shortness of breath, chest pain, fever, chills  Physical Exam  BP (!) 169/92 (BP Location: Left Arm)   Pulse 72   Temp 98.3 F (36.8 C) (Oral)   Resp 16   Ht 5\' 9"  (1.753 m)   Wt 78.9 kg   SpO2 100%   BMI 25.70 kg/m  Gen:   Awake, no distress   Resp:  Normal effort  MSK:   Moves extremities without difficulty  Other:  Right leg with edema from foot up until knee.  I was unable to palpate pedal pulse.  Patient's foot is very warm to touch.  His capillary refill is less than 3 seconds.  He has several chronic ulcers that do not look infected on his right foot.  Medical Decision Making  Medically screening exam initiated at 5:45 PM.  Appropriate orders placed.  Daniel Byrd was informed that the remainder of the evaluation will be completed by another provider, this initial triage assessment does not replace that evaluation, and the importance of remaining in the ED until their evaluation is complete.  I reviewed the note from [facility]  Dr. [facility] who evaluated the patient felt that the lack of pulse was not in arterial occlusion since patient had color to the foot that was warm.  Feels like this is worsening of chronic vascular disease.  I agree with this suggestion.  We can try to obtain Dopplers once patient is in the back.    Daniel Nora, PA-C 07/25/21 1751    07/27/21, DO 07/25/21 2149

## 2021-07-25 NOTE — Consult Note (Addendum)
ASSESSMENT & PLAN   PERIPHERAL ARTERIAL DISEASE: This patient has evidence of infrainguinal arterial occlusive disease on the right with a nonhealing wound on the right third toe and progressive ischemia with rest pain.  This is clearly a limb threatening situation and I have recommended that we proceed with arteriography.  I have scheduled this for tomorrow. I have reviewed with the patient the indications for arteriography. In addition, I have reviewed the potential complications of arteriography including but not limited to: Bleeding, arterial injury, arterial thrombosis, dye action, renal insufficiency, or other unpredictable medical problems. I have explained to the patient that if we find disease amenable to angioplasty we could potentially address this at the same time. I have discussed the potential complications of angioplasty and stenting, including but not limited to: Bleeding, arterial thrombosis, arterial injury, dissection, or the need for surgical intervention.  RIGHT LEG SWELLING: His right leg swelling is likely related to the wound on his foot.  ADDENDUM: His venous duplex scan shows no evidence of DVT.  DIABETES: His CBGs are elevated likely related to his infection.  CHEST PAIN: He had some chest pain earlier today but this sounds musculoskeletal and he did not describe any chest pressure or significant shortness of breath.  Covid pend  REASON FOR CONSULT:    Right leg pain.  The consult is requested by the [facility] emergency department.  HPI:   Daniel Byrd is a 60 y.o. male who presents with progressive ischemia of the right lower extremity.  He has had right calf claudication for a year.  This was gradual in onset.  He experienced this while cutting the grass.  This involved his right calf only and did not involve his thigh or hip.  He denies any symptoms in the left leg.  Over the last several months he developed a gradual onset of rest pain in the right foot.  He  also states that he has had a wound on his right third toe for many months.  Recently the foot was becoming more swollen and he had some redness.  He was told by his primary care physician to go to the emergency department.  He went to the emergency department at [facility].  Of note he had noninvasive studies done at the outlying office which showed markedly diminished flow in the right foot.  His risk factors for peripheral vascular disease include type 2 diabetes and hypertension.  He denies any history of hypercholesterolemia, family history of premature cardiovascular disease, or tobacco use.  He had some chest pain earlier this morning which appears to be related to using his walker which he was using because of his right foot pain.  He does not describe any chest pressure or significant dyspnea on exertion.  Past Medical History:  Diagnosis Date   Abscess    Diabetes mellitus without complication (HCC)     Family History  Problem Relation Age of Onset   Stroke Mother    Diabetes Mother    Heart failure Father     SOCIAL HISTORY: He is widowed. Social History   Tobacco Use   Smoking status: Never   Smokeless tobacco: Never  Substance Use Topics   Alcohol use: No    No Known Allergies  No current facility-administered medications for this encounter.   Current Outpatient Medications  Medication Sig Dispense Refill   cephALEXin (KEFLEX) 500 MG capsule Take 1 capsule (500 mg total) by mouth 3 (three) times daily. For 7 days  21 capsule 0   hydrochlorothiazide (HYDRODIURIL) 25 MG tablet Take 1 tablet (25 mg total) by mouth daily. 30 tablet 0   metFORMIN (GLUCOPHAGE) 500 MG tablet Take 1 tablet (500 mg total) by mouth 2 (two) times daily with a meal. 60 tablet 0   Multiple Vitamin (MULTIVITAMIN WITH MINERALS) TABS tablet Take 1 tablet by mouth daily.     naproxen (NAPROSYN) 500 MG tablet Take 1 tablet (500 mg total) by mouth 2 (two) times daily with a meal. 20 tablet 0     REVIEW OF SYSTEMS:  [X]  denotes positive finding, [ ]  denotes negative finding Cardiac  Comments:  Chest pain or chest pressure:    Shortness of breath upon exertion:    Short of breath when lying flat:    Irregular heart rhythm:        Vascular    Pain in calf, thigh, or hip brought on by ambulation: x Right calf  Pain in feet at night that wakes you up from your sleep:  x Right foot  Blood clot in your veins:    Leg swelling:  x right      Pulmonary    Oxygen at home:    Productive cough:     Wheezing:         Neurologic    Sudden weakness in arms or legs:     Sudden numbness in arms or legs:     Sudden onset of difficulty speaking or slurred speech:    Temporary loss of vision in one eye:     Problems with dizziness:         Gastrointestinal    Blood in stool:     Vomited blood:         Genitourinary    Burning when urinating:     Blood in urine:        Psychiatric    Major depression:         Hematologic    Bleeding problems:    Problems with blood clotting too easily:        Skin    Rashes or ulcers: x Right third toe      Constitutional    Fever or chills:    -  PHYSICAL EXAM:   Vitals:   07/25/21 1429 07/25/21 1643 07/25/21 1730  BP: 138/85 129/81 (!) 169/92  Pulse: 83  72  Resp: 18 15 16   Temp: 98.2 F (36.8 C)  98.3 F (36.8 C)  TempSrc: Oral  Oral  SpO2: 98% 99% 100%  Weight: 78.9 kg    Height: 5\' 9"  (1.753 m)     Body mass index is 25.7 kg/m. GENERAL: The patient is a well-nourished male, in no acute distress. The vital signs are documented above. CARDIAC: There is a regular rate and rhythm.  VASCULAR: I do not detect carotid bruits. On the right side, which is the side of concern, there is a palpable femoral pulse.  I cannot palpate a popliteal or pedal pulses.  He has a dampened monophasic anterior tibial signal with the Doppler only. On the left side he has a palpable femoral, popliteal, and dorsalis pedis pulse. The right foot  is swollen. There is mild erythema. PULMONARY: There is good air exchange bilaterally without wheezing or rales. ABDOMEN: Soft and non-tender with normal pitched bowel sounds.  MUSCULOSKELETAL: There are no major deformities. NEUROLOGIC: No focal weakness or paresthesias are detected. SKIN: He has a wound on the right third toe and also  on the dorsum of the foot as documented in the photograph below.   PSYCHIATRIC: The patient has a normal affect.  DATA:    LABS: His GFR is greater than 60.  Creatinine 0.98.  White blood cell count 12.5.  Hemoglobin 16.6.  Platelets 201,000.  COVID  NEGATIVE  ARTERIAL DOPPLER STUDY: I reviewed the arterial Doppler the study that was done on 07/20/2021.    At that time on the right, he had a dampened monophasic dorsalis pedis signal with the Doppler.  An ABI could not be obtained.  On the left side an ABI could not be obtained because the arteries were noncompressible.     Waverly Ferrari [facility]

## 2021-07-25 NOTE — ED Notes (Signed)
Pt here POV from [facility], here for vascular.

## 2021-07-25 NOTE — Hospital Course (Addendum)
Plan: I will plan to see the patient in 2 weeks for wound care follow-up, new ABIs. Current antiplatelet regimen is dual antiplatelet therapy.  I would like him to transitioned to Xarelto and single antiplatelet (aspirin) this evening, as the lesions in the right leg appeared to be thrombus rather than purely atherosclerotic.   Patient does not have a history of A. fib, but would benefit from Holter monitor.

## 2021-07-25 NOTE — H&P (Addendum)
Date: 07/25/2021               Patient Name:  Daniel Byrd MRN: [id number]  DOB: Mar 16, 1961 Age / Sex: 60 y.o., male   PCP: Royann Shivers, PA-C         Medical Service: Internal Medicine Teaching Service         Attending Physician: Dr. Earl Lagos, MD    First Contact: Dr. Cyndie Chime Pager: [phone]  Second Contact: Dr. Marijo Conception Pager: [phone]       After Hours (After 5p/  First Contact Pager: [phone]  weekends / holidays): Second Contact Pager: [phone]   Chief Complaint: right foot pain  History of Present Illness:   [name] is a 60 year old male with history of type 2 diabetes, hypertension presenting with right foot pain.  States that his right foot has been hurting for the past 2 to 3 months with gradual worsening.  Initially he thought it was back pain as he was having knee pain that radiated to his foot, went to a chiropractor who performed imaging and noted he had 2 discs touching next back. He subsequently went to a Careers adviser, who started him on gabapentin on September 7. Notes that after starting gabapentin, he began noticing isolated right foot swelling and pain.  This past Thursday, he got a blood flow test done and was told he had no blood flow to his right foot.  Also mentions that his pain became a lot worse on Sunday.  He has been putting ice on his foot but notes continued sensation of burning.  Also mentions that he has been taking Xanax 5 mg for sleep and pain relief.  Does note that he had right leg pain about a year ago with strenuous activity, such as mowing his lawn.  Pain would improve with rest.  Never had pain in left leg.  No fevers, chills, N/V, changes in bowel movements, changes in urination.  Notes that he does not want to lose his right foot, but if that is the only other option then he is willing to do so.  He is worried about how he will take care of himself if he were to get an amputation.  States that his blood sugars typically  range in the 100s to 130s at home.  He has been trying to control his diet. He used to be on metformin, but this was stopped due to GI issues. He currently only takes [place].  He follows Dr. Leandrew Koyanagi and PA Vernice Jefferson at [facility].   PMHx: -T2DM -HTN  Meds:  -Lisinopril (unsure dosage) -Jardiance (unsure dosage)  Allergies: Allergies as of 07/25/2021   (No Known Allergies)   Family History:  Mother and older brother with diabetes Older brother passed at 42 (after open heart surgery) Mother passed from CVA Dad had heart problems, needed 4 bypasses.   Social History:  Lives by himself and has a Nurse, mental health. Independent in ADLs. He is on disability, used to work as a Curator. Sister-in-law, Beth, helps with taking him to doctor visits (patient reports low literacy). Never smoked cigarettes. Does not consume alcohol. Smokes marijuana occasionally (once a week).  Review of Systems: A complete ROS was negative except as per HPI.   Physical Exam: Blood pressure (!) 166/95, pulse 70, temperature 98.3 F (36.8 C), temperature source Oral, resp. rate 17, height 5\' 9"  (1.753 m), weight 78.9 kg, SpO2 100 %. Physical Exam Constitutional:      Appearance: Normal appearance.  He is not ill-appearing.  HENT:     Head: Normocephalic and atraumatic.     Mouth/Throat:     Mouth: Mucous membranes are moist.     Pharynx: Oropharynx is clear. No oropharyngeal exudate.  Eyes:     Extraocular Movements: Extraocular movements intact.     Conjunctiva/sclera: Conjunctivae normal.     Pupils: Pupils are equal, round, and reactive to light.  Cardiovascular:     Rate and Rhythm: Normal rate and regular rhythm.     Heart sounds: Normal heart sounds. No murmur heard.   No friction rub. No gallop.     Comments: 1+ DP pulse on left foot, unable to palpate on right foot. Pulmonary:     Effort: Pulmonary effort is normal. No respiratory distress.     Breath sounds: Normal breath sounds. No  wheezing or rhonchi.  Abdominal:     General: Bowel sounds are normal. There is no distension.     Palpations: Abdomen is soft.     Tenderness: There is no abdominal tenderness. There is no guarding or rebound.  Musculoskeletal:     Cervical back: Normal range of motion and neck supple.     Comments: ROM with full range of motion in left foot. ROM limited in right foot due to tenderness. Nonpitting edema in right foot.   Skin:    Comments: Erythema noted on distal right leg (below knee). Right foot cooler to touch than left. Several skin lesions tracking along right leg with eschar formation, including one on third digit.  Neurological:     General: No focal deficit present.     Mental Status: He is alert and oriented to person, place, and time.  Psychiatric:        Mood and Affect: Mood normal.        Behavior: Behavior normal.        Thought Content: Thought content normal.      EKG: personally reviewed my interpretation is NSR, RBBB (similar to prior).   DG Chest 2 View  Result Date: 07/25/2021 CLINICAL DATA:  Right-sided chest pain EXAM: CHEST - 2 VIEW COMPARISON:  12/21/2020 FINDINGS: The heart size and mediastinal contours are within normal limits. Both lungs are clear. The visualized skeletal structures are unremarkable. IMPRESSION: No active cardiopulmonary disease. Electronically Signed   By: Sharlet Salina M.D.   On: 07/25/2021 16:03   VAS [place] LOWER EXTREMITY ARTERIAL DUPLEX (ONLY [facility] & [facility] 7a-7p)  Result Date: 07/25/2021 LOWER EXTREMITY ARTERIAL DUPLEX STUDY Patient Name:  Daniel Byrd  Date of Exam:   07/25/2021 Medical Rec #: [id number]       Accession #:    [id number] Date of Birth: 03/02/1961       Patient Gender: M Patient Age:   41 years Exam Location:  [facility] Procedure:      VAS [place] LOWER EXTREMITY ARTERIAL DUPLEX Referring Phys: GRACE LOEFFLER --------------------------------------------------------------------------------  Indications: Rest pain, ulceration,  and peripheral artery disease. High Risk Factors: Diabetes.  Current ABI: Right= unable to be obtained, left= noncompressible Comparison Study: No prior study Performing Technologist: Gertie Fey MHA, RDMS, RVT, RDCS  Examination Guidelines: A complete evaluation includes B-mode imaging, spectral Doppler, color Doppler, and power Doppler as needed of all accessible portions of each vessel. Bilateral testing is considered an integral part of a complete examination. Limited examinations for reoccurring indications may be performed as noted.  +-----------+--------+-----+---------------+------------------+----------------+ RIGHT      PSV cm/sRatioStenosis       Waveform  Comments         +-----------+--------+-----+---------------+------------------+----------------+ CFA Distal 159                         triphasic                          +-----------+--------+-----+---------------+------------------+----------------+ DFA        133                         triphasic                          +-----------+--------+-----+---------------+------------------+----------------+ SFA Prox   185          30-49% stenosismonophasic        Occluded just                                                             distal to this                                                            sample           +-----------+--------+-----+---------------+------------------+----------------+ SFA Mid    441          75-99% stenosismonophasic                         +-----------+--------+-----+---------------+------------------+----------------+ SFA Distal 62                          monophasic                         +-----------+--------+-----+---------------+------------------+----------------+ POP Prox   29                          Severely damepened                                                        monophasic                          +-----------+--------+-----+---------------+------------------+----------------+ POP Distal 52                          monophasic                         +-----------+--------+-----+---------------+------------------+----------------+ TP Trunk   53                          monophasic                         +-----------+--------+-----+---------------+------------------+----------------+  ATA Prox   98                          monophasic                         +-----------+--------+-----+---------------+------------------+----------------+ ATA Mid    30                          dampened                                                                  monophasic                         +-----------+--------+-----+---------------+------------------+----------------+ ATA Distal 27                          Severely damepened                                                        monophasic                         +-----------+--------+-----+---------------+------------------+----------------+ PTA Prox   20                          dampened                                                                  monophasic                         +-----------+--------+-----+---------------+------------------+----------------+ PTA Mid    11                          Severely dampened                                                         monophasic                         +-----------+--------+-----+---------------+------------------+----------------+ PTA Distal 18                          Severely damepened  monophasic                         +-----------+--------+-----+---------------+------------------+----------------+ PERO Distal             occluded                                          +-----------+--------+-----+---------------+------------------+----------------+ DP          16                          Severely damepened                                                        monophasic                         +-----------+--------+-----+---------------+------------------+----------------+ A focal velocity elevation of 441 cm/s was obtained at Right SFA mid with a VR of 55.1. Findings are characteristic of 75-99% stenosis.  Summary: Right: 75-99% stenosis noted in the superficial femoral artery. Total occlusion noted in the peroneal artery.  See table(s) above for measurements and observations.    Preliminary    VAS [place] LOWER EXTREMITY VENOUS (DVT) (ONLY [facility] & [facility] 7a-7p)  Result Date: 07/25/2021  Lower Venous DVT Study Patient Name:  BECKET WECKER  Date of Exam:   07/25/2021 Medical Rec #: [id number]       Accession #:    [id number] Date of Birth: [birth year]       Patient Gender: M Patient Age:   64 years Exam Location:  [facility] Procedure:      VAS [place] LOWER EXTREMITY VENOUS (DVT) Referring Phys: GRACE LOEFFLER --------------------------------------------------------------------------------  Indications: Edema.  Comparison Study: No prior study Performing Technologist: Gertie Fey MHA, RDMS, RVT, RDCS  Examination Guidelines: A complete evaluation includes B-mode imaging, spectral Doppler, color Doppler, and power Doppler as needed of all accessible portions of each vessel. Bilateral testing is considered an integral part of a complete examination. Limited examinations for reoccurring indications may be performed as noted. The reflux portion of the exam is performed with the patient in reverse Trendelenburg.  +---------+---------------+---------+-----------+----------+--------------+ RIGHT    CompressibilityPhasicitySpontaneityPropertiesThrombus Aging +---------+---------------+---------+-----------+----------+--------------+ CFV      Full           Yes      Yes                                  +---------+---------------+---------+-----------+----------+--------------+ SFJ      Full                                                        +---------+---------------+---------+-----------+----------+--------------+ FV Prox  Full                                                        +---------+---------------+---------+-----------+----------+--------------+  FV Mid   Full                                                        +---------+---------------+---------+-----------+----------+--------------+ FV DistalFull                                                        +---------+---------------+---------+-----------+----------+--------------+ PFV      Full                                                        +---------+---------------+---------+-----------+----------+--------------+ POP      Full           Yes      Yes                                 +---------+---------------+---------+-----------+----------+--------------+ PTV      Full                                                        +---------+---------------+---------+-----------+----------+--------------+ PERO     Full                                                        +---------+---------------+---------+-----------+----------+--------------+     Summary: RIGHT: - There is no evidence of deep vein thrombosis in the lower extremity.  - No cystic structure found in the popliteal fossa.   *See table(s) above for measurements and observations.    Preliminary      Assessment & Plan by Problem: Principal Problem:   Severe peripheral arterial disease (HCC) Active Problems:   Type 2 diabetes mellitus (HCC)   Benign essential HTN  Severe PAD T2DM Patient presenting with worsening claudication, found to have progressive ischemia and nonhealing wound on right third digit.  Venous Dopplers negative for DVT bilaterally.  Vascular duplex showing 75 to 99% stenosis of right SFA and total  occlusion of peroneal artery.  Dr. Edilia Bo, Vascular Surgery, evaluated patient with plan to perform arteriography tomorrow.  Given patient's history of type 2 diabetes, there is concern that his nonhealing foot wound may be diabetic foot ulcer.  Furthermore, he has erythema overlying his right leg distal to his knee with concern for possible cellulitis.  Will place patient on broad-spectrum antibiotics with vancomycin and cefepime to cover for possible Pseudomonas. -NPO @ [facility] -Arteriogram tomorrow with VVS, greatly appreciate VVS recommendations -vanc and cefepime, can narrow coverage post-op if low concern for infection -pain control with scheduled tylenol q8h, oxycodone 5mg  q4h prn for severe pain, and IV dilaudid 0.5mg  q2h prn for breakthrough pain. -moderate SSI  for glycemic control -CBC, BMP tomorrow -lipid panel and HbA1c pending -will need PT/OT evaluation prior to DC  Essential HTN Reports taking lisinopril at home (unclear dosage, but states dose is not too high) with adequate BP control. BP has been ranging from 130s - 180s / 80s - 100s while here, but has remained asymptomatic. Elevation may be due to acute pain given above.   -lisinopril 10mg  daily, may need to adjust dose depending on BP control -monitor vitals  Dispo: Admit patient to Inpatient with expected length of stay greater than 2 midnights.  Signed: Merrilyn Puma, MD 07/25/2021, 9:51 PM  Pager: [phone] After 5pm on weekdays and 1pm on weekends: On Call pager: [phone]

## 2021-07-25 NOTE — Progress Notes (Signed)
Right lower extremity venous and arterial duplex completed. Refer to "CV Proc" under chart review to view preliminary results.  07/25/2021 7:17 PM Eula Fried., MHA, RVT, RDCS, RDMS

## 2021-07-25 NOTE — ED Notes (Signed)
Domingo Madeira, [phone] asking to be called with updates, prior/post surgery.

## 2021-07-25 NOTE — ED Provider Notes (Signed)
Patient sent from [facility] for evaluation of right lower leg pain.  I did get Doppler pulses to bilateral lower extremities.  Dr. Durwin Nora with vascular surgery evaluated the patient.  Arterial study showed some significant stenosis of the superficial right femoral artery.  There is total occlusion of the peroneal artery. no DVT.  Overall vascular surgery will do angio in the morning.  No need for anticoagulation at this time.  We will start antibiotics as concern for possible diabetic foot infection likely complicated by peripheral vascular disease.  Patient admitted to medicine service for further care.  No signs to suggest sepsis.  This chart was dictated using voice recognition software.  Despite best efforts to proofread,  errors can occur which can change the documentation meaning.    Virgina Norfolk, DO 07/25/21 [phone]

## 2021-07-26 ENCOUNTER — Inpatient Hospital Stay (HOSPITAL_COMMUNITY): Payer: Medicare Other

## 2021-07-26 ENCOUNTER — Other Ambulatory Visit (HOSPITAL_COMMUNITY): Payer: Self-pay

## 2021-07-26 ENCOUNTER — Inpatient Hospital Stay (HOSPITAL_COMMUNITY)
Admission: EM | Disposition: A | Discharge status: Home / Self Care | Payer: Self-pay | Source: Home / Self Care | Attending: Internal Medicine

## 2021-07-26 ENCOUNTER — Encounter (HOSPITAL_COMMUNITY): Payer: Self-pay | Admitting: Vascular Surgery

## 2021-07-26 DIAGNOSIS — I739 Peripheral vascular disease, unspecified: Secondary | ICD-10-CM | POA: Diagnosis not present

## 2021-07-26 HISTORY — PX: PERIPHERAL VASCULAR BALLOON ANGIOPLASTY: CATH118281

## 2021-07-26 HISTORY — PX: PERIPHERAL VASCULAR INTERVENTION: CATH118257

## 2021-07-26 HISTORY — PX: ABDOMINAL AORTOGRAM W/LOWER EXTREMITY: CATH118223

## 2021-07-26 LAB — BASIC METABOLIC PANEL
Anion gap: 8 (ref 5–15)
BUN: 19 mg/dL (ref 8–23)
CO2: 22 mmol/L (ref 22–32)
Calcium: 9.1 mg/dL (ref 8.9–10.3)
Chloride: 108 mmol/L (ref 98–111)
Creatinine, Ser: 0.97 mg/dL (ref 0.61–1.24)
GFR, Estimated: >60 mL/min (ref >60)
Glucose, Bld: 125 mg/dL — ABNORMAL HIGH (ref 70–99)
Potassium: 3.5 mmol/L (ref 3.5–5.1)
Sodium: 138 mmol/L (ref 135–145)

## 2021-07-26 LAB — GLUCOSE, CAPILLARY
Glucose-Capillary: 155 mg/dL — ABNORMAL HIGH (ref 70–99)
Glucose-Capillary: 154 mg/dL — ABNORMAL HIGH (ref 70–99)
Glucose-Capillary: 129 mg/dL — ABNORMAL HIGH (ref 70–99)
Glucose-Capillary: 177 mg/dL — ABNORMAL HIGH (ref 70–99)

## 2021-07-26 LAB — HEMOGLOBIN A1C
Hgb A1c MFr Bld: 8.6 % — ABNORMAL HIGH (ref 4.8–5.6)
Mean Plasma Glucose: 200 mg/dL

## 2021-07-26 LAB — HIV ANTIBODY (ROUTINE TESTING W REFLEX): HIV Screen 4th Generation wRfx: NONREACTIVE

## 2021-07-26 LAB — APTT: aPTT: 27 seconds (ref 24–36)

## 2021-07-26 LAB — CBC
HCT: 48.2 % (ref 39.0–52.0)
Hemoglobin: 16.2 g/dL (ref 13.0–17.0)
MCH: 30.3 pg (ref 26.0–34.0)
MCHC: 33.6 g/dL (ref 30.0–36.0)
MCV: 90.1 fL (ref 80.0–100.0)
Platelets: 192 10*3/uL (ref 150–400)
RBC: 5.35 MIL/uL (ref 4.22–5.81)
RDW: 13.3 % (ref 11.5–15.5)
WBC: 14.3 10*3/uL — ABNORMAL HIGH (ref 4.0–10.5)
nRBC: 0 % (ref 0.0–0.2)

## 2021-07-26 LAB — PROTIME-INR
INR: 1.1 (ref 0.8–1.2)
Prothrombin Time: 14.4 seconds (ref 11.4–15.2)

## 2021-07-26 SURGERY — ABDOMINAL AORTOGRAM W/LOWER EXTREMITY
Anesthesia: LOCAL | Laterality: Right

## 2021-07-26 MED ORDER — HEPARIN SODIUM (PORCINE) 1000 UNIT/ML IJ SOLN
INTRAMUSCULAR | Status: AC
  Filled 2021-07-26: qty 1

## 2021-07-26 MED ORDER — ASPIRIN 325 MG PO TABS
ORAL_TABLET | ORAL | Status: AC
  Filled 2021-07-26: qty 1

## 2021-07-26 MED ORDER — CLOPIDOGREL BISULFATE 300 MG PO TABS
ORAL_TABLET | ORAL | Status: DC | PRN
  Administered 2021-07-26: 300 mg via ORAL

## 2021-07-26 MED ORDER — CLOPIDOGREL BISULFATE 75 MG PO TABS
75.0000 mg | ORAL_TABLET | Freq: Every day | ORAL | Status: DC
Start: 2021-07-27 — End: 2021-07-26

## 2021-07-26 MED ORDER — LABETALOL HCL 5 MG/ML IV SOLN: 10.0000 mg | INTRAVENOUS | Status: DC | PRN

## 2021-07-26 MED ORDER — LIDOCAINE HCL (PF) 1 % IJ SOLN
INTRAMUSCULAR | Status: DC | PRN
  Administered 2021-07-26: 17 mL via INTRADERMAL

## 2021-07-26 MED ORDER — HEPARIN SODIUM (PORCINE) 5000 UNIT/ML IJ SOLN: 5000.0000 [IU] | Freq: Three times a day (TID) | INTRAMUSCULAR | Status: DC

## 2021-07-26 MED ORDER — RIVAROXABAN 15 MG PO TABS
15.0000 mg | ORAL_TABLET | Freq: Two times a day (BID) | ORAL | Status: DC
  Administered 2021-07-26 – 2021-07-27 (×2): 15 mg via ORAL
  Filled 2021-07-26 (×3): qty 1

## 2021-07-26 MED ORDER — SODIUM CHLORIDE 0.9% FLUSH
3.0000 mL | Freq: Two times a day (BID) | INTRAVENOUS | Status: DC
  Administered 2021-07-27 – 2021-07-28 (×2): 3 mL via INTRAVENOUS

## 2021-07-26 MED ORDER — IODIXANOL 320 MG/ML IV SOLN
INTRAVENOUS | Status: DC | PRN
  Administered 2021-07-26: 190 mL via INTRA_ARTERIAL

## 2021-07-26 MED ORDER — HEPARIN SODIUM (PORCINE) 1000 UNIT/ML IJ SOLN
INTRAMUSCULAR | Status: DC | PRN
  Administered 2021-07-26: 3000 [IU] via INTRAVENOUS
  Administered 2021-07-26: 7000 [IU] via INTRAVENOUS

## 2021-07-26 MED ORDER — SODIUM CHLORIDE 0.9 % IV SOLN: 250.0000 mL | INTRAVENOUS | Status: DC | PRN

## 2021-07-26 MED ORDER — FENTANYL CITRATE (PF) 100 MCG/2ML IJ SOLN
INTRAMUSCULAR | Status: AC
  Filled 2021-07-26: qty 2

## 2021-07-26 MED ORDER — CLOPIDOGREL BISULFATE 300 MG PO TABS
ORAL_TABLET | ORAL | Status: AC
  Filled 2021-07-26: qty 1

## 2021-07-26 MED ORDER — OXYCODONE-ACETAMINOPHEN 5-325 MG PO TABS
1.0000 | ORAL_TABLET | Freq: Four times a day (QID) | ORAL | Status: DC
Start: 2021-07-26 — End: 2021-07-28
  Administered 2021-07-26 – 2021-07-28 (×7): 1 via ORAL
  Filled 2021-07-26 (×7): qty 1

## 2021-07-26 MED ORDER — HYDRALAZINE HCL 20 MG/ML IJ SOLN: 5.0000 mg | INTRAMUSCULAR | Status: DC | PRN

## 2021-07-26 MED ORDER — ATORVASTATIN CALCIUM 80 MG PO TABS
80.0000 mg | ORAL_TABLET | Freq: Every day | ORAL | Status: DC
  Administered 2021-07-26 – 2021-07-28 (×3): 80 mg via ORAL
  Filled 2021-07-26 (×3): qty 1

## 2021-07-26 MED ORDER — SODIUM CHLORIDE 0.9 % IV SOLN: INTRAVENOUS | Status: DC

## 2021-07-26 MED ORDER — SODIUM CHLORIDE 0.9% FLUSH: 3.0000 mL | INTRAVENOUS | Status: DC | PRN

## 2021-07-26 MED ORDER — ASPIRIN 325 MG PO TABS
ORAL_TABLET | ORAL | Status: DC | PRN
  Administered 2021-07-26: 325 mg via ORAL

## 2021-07-26 MED ORDER — ASPIRIN EC 325 MG PO TBEC: 325.0000 mg | DELAYED_RELEASE_TABLET | Freq: Every day | ORAL | Status: DC

## 2021-07-26 MED ORDER — NITROGLYCERIN 1 MG/10 ML FOR IR/CATH LAB
INTRA_ARTERIAL | Status: AC
  Filled 2021-07-26: qty 10

## 2021-07-26 MED ORDER — HEPARIN (PORCINE) IN NACL 1000-0.9 UT/500ML-% IV SOLN
INTRAVENOUS | Status: AC
  Filled 2021-07-26: qty 1000

## 2021-07-26 MED ORDER — NITROGLYCERIN 1 MG/10 ML FOR IR/CATH LAB
INTRA_ARTERIAL | Status: DC | PRN
  Administered 2021-07-26 (×2): 200 ug via INTRA_ARTERIAL

## 2021-07-26 MED ORDER — SODIUM CHLORIDE 0.9 % WEIGHT BASED INFUSION
1.0000 mL/kg/h | INTRAVENOUS | Status: AC
  Administered 2021-07-26: 1 mL/kg/h via INTRAVENOUS

## 2021-07-26 MED ORDER — MIDAZOLAM HCL 2 MG/2ML IJ SOLN
INTRAMUSCULAR | Status: DC | PRN
  Administered 2021-07-26 (×2): 1 mg via INTRAVENOUS

## 2021-07-26 MED ORDER — LIDOCAINE HCL (PF) 1 % IJ SOLN
INTRAMUSCULAR | Status: AC
  Filled 2021-07-26: qty 30

## 2021-07-26 MED ORDER — FENTANYL CITRATE (PF) 100 MCG/2ML IJ SOLN
INTRAMUSCULAR | Status: DC | PRN
  Administered 2021-07-26 (×2): 50 ug via INTRAVENOUS

## 2021-07-26 MED ORDER — CLOPIDOGREL BISULFATE 75 MG PO TABS: 300.0000 mg | ORAL_TABLET | Freq: Once | ORAL | Status: DC

## 2021-07-26 MED ORDER — MIDAZOLAM HCL 2 MG/2ML IJ SOLN
INTRAMUSCULAR | Status: AC
  Filled 2021-07-26: qty 2

## 2021-07-26 MED ORDER — ASPIRIN EC 81 MG PO TBEC
81.0000 mg | DELAYED_RELEASE_TABLET | Freq: Every day | ORAL | Status: DC
  Administered 2021-07-27 – 2021-07-28 (×2): 81 mg via ORAL
  Filled 2021-07-26 (×2): qty 1

## 2021-07-26 SURGICAL SUPPLY — 27 items
BALL STERLING OTW 2.5X100X150 (BALLOONS) ×1
BALLN MUSTANG 5X150X135 (BALLOONS) ×3
BALLN STERLING OTW 2.5X100X150 (BALLOONS) ×2
BALLN STERLING OTW 3X220X150 (BALLOONS) ×3
BALLN STERLING OTW 5X60X135 (BALLOONS) ×3
BALLOON MUSTANG 5X150X135 (BALLOONS) ×2 IMPLANT
BALLOON STERLING OTW 3X220X150 (BALLOONS) ×2 IMPLANT
BALLOON STERLING OTW 5X60X135 (BALLOONS) ×2 IMPLANT
BALLOON STRLNG OTW 2.5X100X150 (BALLOONS) ×2 IMPLANT
CATH ANGIO 5F PIGTAIL 65CM (CATHETERS) ×3 IMPLANT
CATH NAVICROSS ANGLED 135CM (MICROCATHETER) ×3 IMPLANT
CATH QUICKCROSS .035X135CM (MICROCATHETER) ×3 IMPLANT
CLOSURE PERCLOSE PROSTYLE (VASCULAR PRODUCTS) ×3 IMPLANT
COVER DOME SNAP 22 D (MISCELLANEOUS) ×3 IMPLANT
GLIDEWIRE ADV .035X260CM (WIRE) ×3 IMPLANT
KIT ENCORE 26 ADVANTAGE (KITS) ×3 IMPLANT
KIT MICROPUNCTURE NIT STIFF (SHEATH) ×3 IMPLANT
KIT PV (KITS) ×3 IMPLANT
SHEATH HIGHFLEX ANSEL 6FRX55 (SHEATH) ×3 IMPLANT
SHEATH PINNACLE 5F 10CM (SHEATH) ×3 IMPLANT
SHEATH PROBE COVER 6X72 (BAG) ×3 IMPLANT
STENT INNOVA 6X150X130 (Permanent Stent) ×3 IMPLANT
STENT INNOVA 6X80X130 (Permanent Stent) ×3 IMPLANT
TRANSDUCER W/STOPCOCK (MISCELLANEOUS) ×3 IMPLANT
TRAY PV CATH (CUSTOM PROCEDURE TRAY) ×3 IMPLANT
WIRE G V18X300CM (WIRE) ×6 IMPLANT
WIRE STARTER BENTSON 035X150 (WIRE) ×3 IMPLANT

## 2021-07-26 NOTE — Progress Notes (Signed)
  Daily Progress Note   Subjective: Patient seen and examined this morning with right leg [place] 5 critical limb ischemia Nervous about his procedure, otherwise doing fine  Objective: Vitals:   07/26/21 0945 07/26/21 1021  BP: 116/71   Pulse: 76   Resp: 10   Temp:  98.1 F (36.7 C)  SpO2: 93%     Physical Examination Palpable pulses bilaterally in the groins, nonpalpable pulses in the right lower extremity Tissue loss on the first and second digits  ASSESSMENT/PLAN:  After discussing the risk and benefits of right lower extremity angiography in an attempt to improve distal perfusion, Daniel Byrd elected to proceed.    Plan, right lower extremity angiography   Fara Olden MD MS [facility] [phone] 07/26/2021  10:35 AM

## 2021-07-26 NOTE — ED Notes (Signed)
Called cath lab at this time to give report. No report needed. Pt transported to room 8 in cathlab by ED tech.

## 2021-07-26 NOTE — Progress Notes (Signed)
Pt receved from cath lab. VSS. L groin level 0. Bilateral DP pulses dopplered. Pt oriented to room and unit. Call light in reach.  Versie Starks, RN

## 2021-07-26 NOTE — Op Note (Signed)
Patient name: Daniel Byrd MRN: [id number] DOB: September 03, 1961 Sex: male  07/26/2021 Pre-operative Diagnosis: Daniel Byrd critical limb ischemia in the right leg Post-operative diagnosis:  Same Surgeon:  Victorino Sparrow, MD Procedure Performed: 1.  Ultrasound-guided access of the left common femoral artery 2.  SFA stenting, 6 x 80 mm, 6 x 150 mm for chronic total occlusion 3.  3 x 150 millimeter angioplasty of the posterior tibial artery 4.  2.Byrd x 100 mm angioplasty of the medial plantar artery Byrd.  Byrd x 60 mm angioplasty of the popliteal artery 6.  Left common femoral artery closure using a ProGlide device 7.  Left common femoral diagnostic angiography 160 minutes of moderate sedation Contrast Total  Indications: Patient is a 60 year old male who presented to the hospital with Daniel Byrd critical limb ischemia.  He has nonhealing wounds on the first and second digits of his right foot with noted rest pain that has been present for a number of months.  Discussing the risk and benefits of angiography in an attempt to improve right lower extremity perfusion, Daniel Byrd elected to proceed.  Findings:  Aortogram: Bilateral patent renal arteries, mild atherosclerotic disease of the infrarenal abdominal aorta  On the right: normal common iliac artery, hypogastric artery, external iliac artery, common femoral artery, profunda.  The superficial femoral artery was occluded 4 cm from its ostia.  This reconstituted at the distal SFA after 23cm.  Distally there was flow-limiting stenosis of the popliteal artery.  There was single-vessel anterior tibial artery runoff with poor filling of the foot.  The tibioperoneal trunk was occluded.  There appeared to be constitution of the posterior tibial artery multiple points, however there was no flow to the foot.  On the left: Normal common iliac artery, hypogastric artery, external iliac artery, common femoral artery, profunda, SFA   Procedure:  The  patient was identified in the holding area and taken to room 8.  The patient was then placed supine on the table and prepped and draped in the usual sterile fashion.  A time out was called.  Ultrasound was used to evaluate the left common femoral artery.  It was patent .  A digital ultrasound image was acquired.  A micropuncture needle was used to access the left common femoral artery under ultrasound guidance.  An 018 wire was advanced without resistance and a micropuncture sheath was placed.  The 018 wire was removed and a benson wire was placed.  The micropuncture sheath was exchanged for a Byrd french sheath.  An omniflush catheter was advanced over the wire to the level of L-1.  An abdominal angiogram was obtained.  Next, using the omniflush catheter and a benson wire, the aortic bifurcation was crossed and the catheter was placed into theright external iliac artery and right runoff was obtained.  Left common femoral artery angiogram was performed at case completion to ensure no access complications.  Findings as above  Intervention: Patient was heparinized with 7000 units IV heparin and a 6 x 45 catheter was brought into the field and placed into the right common femoral arter. Next, using a series of wires and catheters, the 23 cm long superficial femoral artery occlusion was traversed.  True lumen was confirmed using angiography and the superficial femoral artery was stented using a 6 x 150, 6 x 80 mm self-expanding stents.  A Byrd mm angioplasty balloon was used for dilation.  This demonstrated excellent result with resolution of the occlusion.  Next, our attention turned  to the patient's single-vessel runoff.  The decision was made to try and recanalize the patient's posterior tibial artery.  Using a series of wires and catheters, the artery was traversed and wire placed in the medial plantar artery in the foot.  150 x 3 mm balloon was brought to field and inflated along the entirety of the artery.   Completion angiography demonstrated excellent result however, continued to be no flow in the foot.  A 2.Byrd x 100 mm balloon was brought to the field and expanded in the medial plantar artery.  This demonstrated excellent result with recanalization of the pedal arch.  Prior to case completion, a Byrd x 60 mm balloon was brought into the field and the P3 portion of the popliteal artery was angioplastied as a critical stenosis was appreciated.  At case completion, the patient undergone stenting of the SFA, angioplasty of the popliteal artery, angioplasty of the posterior tibial artery, angioplasty of the lateral plantar artery.  He now has two-vessel runoff to the foot and patent pedal arch. Patient's left common femoral artery was closed using a ProGlide device without issue.   Daniel Olden, MD [facility] [place] Office: [phone]   Plan: I will plan to see the patient in 2 weeks for wound care follow-up, new ABIs. Current antiplatelet regimen is dual antiplatelet therapy.  I would like him to transitioned to Xarelto and single antiplatelet (aspirin) this evening, as the lesions in the right leg appeared to be thrombus rather than purely atherosclerotic.   Patient does not have a history of A. fib, but would benefit from Holter monitor.

## 2021-07-26 NOTE — TOC Benefit Eligibility Note (Signed)
Patient Product/process development scientist completed.    The patient is currently admitted and upon discharge could be taking Xarelto Starter Pack.  The current 30 day co-pay is, $9.85.   The patient is insured through [employer] Part D    Daniel Byrd, CPhT Pharmacy Patient Advocate Specialist [facility] Antimicrobial Stewardship Team Direct Number: [phone]  Fax: [phone]

## 2021-07-26 NOTE — Progress Notes (Signed)
HD 1 SUBJECTIVE:  Patient Summary: Daniel Byrd is a 60 y.o. male with a pertinent PMH of type 2 diabetes and hypertension, who presented with worsening right foot pain and found to have critical limb ischemia in his right lower extremity.  Overnight Events: None  Patient was seen this AM laying in bed. Reported that he is feeling anxious about arteriogram today. He continued to endorse pain in right lower extremity, stating that he feels it throbbing.   OBJECTIVE:  Vital Signs: Vitals:   07/26/21 1246 07/26/21 1320 07/26/21 1600 07/26/21 1706  BP: 130/90 (!) 125/99 (!) 149/86 109/84  Pulse: 78 76 81 79  Resp: 17 14 20 12   Temp:  97.8 F (36.6 C) 97.9 F (36.6 C)   TempSrc:  Oral Oral   SpO2: 99% 100% 99% 99%  Weight:      Height:       Supplemental O2: Room Air SpO2: 99 %  Filed Weights   07/25/21 1429  Weight: 78.9 kg     Intake/Output Summary (Last 24 hours) at 07/26/2021 1804 Last data filed at 07/26/2021 1515 Gross per 24 hour  Intake 780.66 ml  Output 400 ml  Net 380.66 ml    Net IO Since Admission: 380.66 mL [07/26/21 1804]  Physical Exam: Physical Exam Vitals and nursing note reviewed.  HENT:     Head: Normocephalic and atraumatic.  Eyes:     Extraocular Movements: Extraocular movements intact.  Cardiovascular:     Rate and Rhythm: Normal rate and regular rhythm.  Pulmonary:     Effort: Pulmonary effort is normal.     Breath sounds: Normal breath sounds.  Abdominal:     General: Abdomen is flat.     Palpations: Abdomen is soft.  Musculoskeletal:     Left lower leg: Normal.     Comments: Left LE is normal. Right lower leg: Shiny, decreased hair, skin is warm, multiple erythematous lesions on anterior tibia Right foot: Nonpitting swelling present, erythematous, nonpalpable pedal pulses, lesion with central eschar on third digit of right foot   Skin:    General: Skin is warm and dry.  Neurological:     Mental Status: He is alert and oriented to  person, place, and time.  Psychiatric:     Comments: Anxious    Pertinent Labs: CBC Latest Ref Rng & Units 07/26/2021 07/25/2021 12/21/2020  WBC 4.0 - 10.5 K/uL 14.3(H) 12.5(H) 18.1(H)  Hemoglobin 13.0 - 17.0 g/dL 23.7 62.8 31.5  Hematocrit 39.0 - 52.0 % 48.2 48.9 49.0  Platelets 150 - 400 K/uL 192 201 269    CMP Latest Ref Rng & Units 07/26/2021 07/25/2021 12/21/2020  Glucose 70 - 99 mg/dL 176(H) 607(P) 710(G)  BUN 8 - 23 mg/dL 19 26(R) 16  Creatinine 0.61 - 1.24 mg/dL 4.85 4.62 7.03  Sodium 135 - 145 mmol/L 138 139 135  Potassium 3.5 - 5.1 mmol/L 3.5 4.4 3.8  Chloride 98 - 111 mmol/L 108 106 99  CO2 22 - 32 mmol/L 22 24 25   Calcium 8.9 - 10.3 mg/dL 9.1 9.5 9.6  Total Protein 6.5 - 8.1 g/dL - - 8.5(H)  Total Bilirubin 0.3 - 1.2 mg/dL - - 0.5  Alkaline Phos 38 - 126 U/L - - 67  AST 15 - 41 U/L - - 14(L)  ALT 0 - 44 U/L - - 14    Recent Labs    07/26/21 0756 07/26/21 1336 07/26/21 1614  GLUCAP 155* 154* 129*     Pertinent Imaging: PERIPHERAL  VASCULAR CATHETERIZATION  Result Date: 07/26/2021 Images from the original result were not included. Patient name: Daniel Byrd MRN: [id number] DOB: [birth year] Sex: male 07/26/2021 Pre-operative Diagnosis: Rutherford 5 critical limb ischemia in the right leg Post-operative diagnosis:  Same Surgeon:  Victorino Sparrow, MD Procedure Performed: 1.  Ultrasound-guided access of the left common femoral artery 2.  SFA stenting, 6 x 80 mm, 6 x 150 mm for chronic total occlusion 3.  3 x 150 mm angioplasty of the posterior tibial artery 4.  2.5 x 100 mm angioplasty of the medial plantar artery 5.  5 x 60 mm angioplasty of the popliteal artery 6.  Left common femoral artery closure using a ProGlide device 7.  Left common femoral diagnostic angiography 160 minutes of moderate sedation Indications: Patient is a 60 year old male who presented to the hospital with Rutherford 5 critical limb ischemia.  He has nonhealing wounds on the first and second digits of his  right foot with noted rest pain that has been present for a number of months.  Discussing the risk and benefits of angiography in an attempt to improve right lower extremity perfusion, Fitz elected to proceed. Findings: Aortogram: Bilateral patent renal arteries, mild atherosclerotic disease of the infrarenal abdominal aorta On the right: normal common iliac artery, hypogastric artery, external iliac artery, common femoral artery, profunda.  The superficial femoral artery was occluded 4 cm from its ostia.  This reconstituted at the distal SFA after 23cm. Distally there was atherosclerotic diease and flow-limiting stenosis of the popliteal artery.  There was single-vessel anterior tibial artery runoff with poor filling of the foot.  The tibioperoneal trunk was occluded.  There appeared to be constitution of the posterior tibial artery multiple points, however there was no flow to the foot. On the left: Normal common iliac artery, hypogastric artery, external iliac artery, common femoral artery, profunda, SFA  Procedure:  The patient was identified in the holding area and taken to room 8.  The patient was then placed supine on the table and prepped and draped in the usual sterile fashion.  A time out was called.  Ultrasound was used to evaluate the left common femoral artery.  It was patent .  A digital ultrasound image was acquired.  A micropuncture needle was used to access the left common femoral artery under ultrasound guidance.  An 018 wire was advanced without resistance and a micropuncture sheath was placed.  The 018 wire was removed and a benson wire was placed.  The micropuncture sheath was exchanged for a 5 french sheath.  An omniflush catheter was advanced over the wire to the level of L-1.  An abdominal angiogram was obtained.  Next, using the omniflush catheter and a benson wire, the aortic bifurcation was crossed and the catheter was placed into theright external iliac artery and right runoff was  obtained.  Left common femoral artery angiogram was performed at case completion to ensure no access complications. Findings as above Intervention: Patient was heparinized with 7000 units IV heparin and a 6 x 45 catheter was brought into the field and placed into the right common femoral arter. Next, using a series of wires and catheters, the 23 cm long superficial femoral artery occlusion was traversed.  The lesion was soft. True lumen was confirmed using angiography and the superficial femoral artery was stented using a 6 x 150, 6 x 80 mm self-expanding stents.  A 5 mm angioplasty balloon was used for dilation.  This demonstrated excellent result with resolution of  the occlusion.  Next, our attention turned to the patient's single-vessel runoff.  The decision was made to try and recanalize the patient's posterior tibial artery.  Using a series of wires and catheters, the artery was traversed and wire placed in the medial plantar artery in the foot.   The lesion was soft concerning for thrombus. 150 x 3 mm balloon was brought to field and inflated along the entirety of the artery.  Completion angiography demonstrated excellent result however, continued to be no flow in the foot.  A 2.5 x 100 mm balloon was brought to the field and expanded in the medial plantar artery.  This demonstrated excellent result with recanalization of the pedal arch.  Prior to case completion, a 5 x 60 mm balloon was brought into the field and the P3 portion of the popliteal artery was angioplastied as a critical stenosis was appreciated. At case completion, the patient undergone stenting of the SFA, angioplasty of the popliteal artery, angioplasty of the posterior tibial artery, angioplasty of the lateral plantar artery.  He now has two-vessel runoff to the foot and patent pedal arch. Patient's left common femoral artery was closed using a ProGlide device without issue. Fara Olden, MD [facility] Office:  [phone] Plan: I will plan to see the patient in 2 weeks for wound care follow-up, new ABIs. Current antiplatelet regimen is dual antiplatelet therapy.  I would like him to transitioned to Xarelto and single antiplatelet (aspirin) this evening, as the lesions in the right leg appeared to be thrombus rather than purely atherosclerotic. Patient does not have a history of A. fib, but would benefit from Holter monitor.  VAS [place] LOWER EXTREMITY ARTERIAL DUPLEX (ONLY [facility] & [facility] 7a-7p)  Result Date: 07/26/2021 LOWER EXTREMITY ARTERIAL DUPLEX STUDY Patient Name:  LONZELL DORRIS  Date of Exam:   07/25/2021 Medical Rec #: [id number]       Accession #:    [id number] Date of Birth: 1961-04-27       Patient Gender: M Patient Age:   29 years Exam Location:  [facility] Procedure:      VAS [place] LOWER EXTREMITY ARTERIAL DUPLEX Referring Phys: GRACE LOEFFLER --------------------------------------------------------------------------------  Indications: Rest pain, ulceration, and peripheral artery disease. High Risk Factors: Diabetes.  Current ABI: Right= unable to be obtained, left= noncompressible Comparison Study: No prior study Performing Technologist: Gertie Fey MHA, RDMS, RVT, RDCS  Examination Guidelines: A complete evaluation includes B-mode imaging, spectral Doppler, color Doppler, and power Doppler as needed of all accessible portions of each vessel. Bilateral testing is considered an integral part of a complete examination. Limited examinations for reoccurring indications may be performed as noted.  +-----------+--------+-----+---------------+------------------+----------------+ RIGHT      PSV cm/sRatioStenosis       Waveform          Comments         +-----------+--------+-----+---------------+------------------+----------------+ CFA Distal 159                         triphasic                          +-----------+--------+-----+---------------+------------------+----------------+ DFA         133                         triphasic                          +-----------+--------+-----+---------------+------------------+----------------+  SFA Prox   185          30-49% stenosismonophasic        Occluded just                                                             distal to this                                                            sample           +-----------+--------+-----+---------------+------------------+----------------+ SFA Mid    441          75-99% stenosismonophasic                         +-----------+--------+-----+---------------+------------------+----------------+ SFA Distal 62                          monophasic                         +-----------+--------+-----+---------------+------------------+----------------+ POP Prox   29                          Severely damepened                                                        monophasic                         +-----------+--------+-----+---------------+------------------+----------------+ POP Distal 52                          monophasic                         +-----------+--------+-----+---------------+------------------+----------------+ TP Trunk   53                          monophasic                         +-----------+--------+-----+---------------+------------------+----------------+ ATA Prox   98                          monophasic                         +-----------+--------+-----+---------------+------------------+----------------+ ATA Mid    30                          dampened  monophasic                         +-----------+--------+-----+---------------+------------------+----------------+ ATA Distal 27                          Severely damepened                                                        monophasic                          +-----------+--------+-----+---------------+------------------+----------------+ PTA Prox   20                          dampened                                                                  monophasic                         +-----------+--------+-----+---------------+------------------+----------------+ PTA Mid    11                          Severely dampened                                                         monophasic                         +-----------+--------+-----+---------------+------------------+----------------+ PTA Distal 18                          Severely damepened                                                        monophasic                         +-----------+--------+-----+---------------+------------------+----------------+ PERO Distal             occluded                                          +-----------+--------+-----+---------------+------------------+----------------+ DP         16                          Severely damepened  monophasic                         +-----------+--------+-----+---------------+------------------+----------------+ A focal velocity elevation of 441 cm/s was obtained at Right SFA mid with a VR of 55.1. Findings are characteristic of 75-99% stenosis.  Summary: Right: 75-99% stenosis noted in the superficial femoral artery. Total occlusion noted in the peroneal artery.  See table(s) above for measurements and observations. Electronically signed by Heath Lark on 07/26/2021 at 5:31:20 PM.    Final    VAS [place] LOWER EXTREMITY VENOUS (DVT) (ONLY [facility] & [facility] 7a-7p)  Result Date: 07/26/2021  Lower Venous DVT Study Patient Name:  DAVIONTE LUSBY  Date of Exam:   07/25/2021 Medical Rec #: [id number]       Accession #:    [id number] Date of Birth: 15-Feb-1961       Patient Gender: M Patient Age:   78 years Exam Location:  [facility] Procedure:       VAS [place] LOWER EXTREMITY VENOUS (DVT) Referring Phys: GRACE LOEFFLER --------------------------------------------------------------------------------  Indications: Edema.  Comparison Study: No prior study Performing Technologist: Gertie Fey MHA, RDMS, RVT, RDCS  Examination Guidelines: A complete evaluation includes B-mode imaging, spectral Doppler, color Doppler, and power Doppler as needed of all accessible portions of each vessel. Bilateral testing is considered an integral part of a complete examination. Limited examinations for reoccurring indications may be performed as noted. The reflux portion of the exam is performed with the patient in reverse Trendelenburg.  +---------+---------------+---------+-----------+----------+--------------+ RIGHT    CompressibilityPhasicitySpontaneityPropertiesThrombus Aging +---------+---------------+---------+-----------+----------+--------------+ CFV      Full           Yes      Yes                                 +---------+---------------+---------+-----------+----------+--------------+ SFJ      Full                                                        +---------+---------------+---------+-----------+----------+--------------+ FV Prox  Full                                                        +---------+---------------+---------+-----------+----------+--------------+ FV Mid   Full                                                        +---------+---------------+---------+-----------+----------+--------------+ FV DistalFull                                                        +---------+---------------+---------+-----------+----------+--------------+ PFV      Full                                                        +---------+---------------+---------+-----------+----------+--------------+  POP      Full           Yes      Yes                                  +---------+---------------+---------+-----------+----------+--------------+ PTV      Full                                                        +---------+---------------+---------+-----------+----------+--------------+ PERO     Full                                                        +---------+---------------+---------+-----------+----------+--------------+     Summary: RIGHT: - There is no evidence of deep vein thrombosis in the lower extremity.  - No cystic structure found in the popliteal fossa.   *See table(s) above for measurements and observations. Electronically signed by Heath Lark on 07/26/2021 at 5:31:38 PM.    Final     ASSESSMENT/PLAN:  Assessment: Principal Problem:   Severe peripheral arterial disease (HCC) Active Problems:   Type 2 diabetes mellitus (HCC)   Benign essential HTN  Jaylend Reiland is a 60 y.o. male with a pertinent PMH of type 2 diabetes and hypertension, who presented with worsening right foot pain and found to have critical limb ischemia in his right lower extremity.  HD 1  Plan: #Severe PVD with likely critical limb ischemia and possible cellulitis Patient with history of hypertension and T2DM, presents with right LE pain that is consistent with critical limb ischemia in the setting of severe PAD and possible overlying cellulitis. Right foot x-ray ordered to rule out osteomyelitis. He is afebrile, but has upcoming leukocytosis. Will continue broad-spectrum vancomycin and cefepime for pseudomonal coverage. Arteriogram scheduled for today, with possible stents.  LDL 146, will start patient on high-dose statin. Vascular surgery is on board, appreciate recommendations and assistance. -Continue pain regiment -Continue vancomycin and cefepime -Follow-up CBC and BMP -Follow-up right foot x-ray -Start atorvastatin 80 mg daily  #T2DM Fasting glucose 122 this a.m. -Continue moderate SSI for glycemic control -Pending A1c  #Essential  hypertension -Continue home lisinopril 10 mg  Best Practice: Diet:  Heart Healthy/Carb Mod IVF: NS,100cc/hr VTE:  Xarelto Code: Full Code  PT/OT recs: Pending, none. TOC recs: Pending  Prior to Admission Living Arrangement: Home  Anticipated Discharge Location: SNF  Barriers to Discharge: Medical management  Dispo: Anticipated discharge in 1-2 days.  Signature: Princess Bruins, D.O. Psychiatry Resident, PGY-1 [facility] Teaching Service Pager: [phone] 6:04 PM, 07/26/2021   Please contact the on call pager after 5 pm and on weekends at [phone].

## 2021-07-26 NOTE — Progress Notes (Signed)
Mobility Specialist Progress Note    07/26/21 1712  Mobility  Activity Ambulated in hall  Level of Assistance Contact guard assist, steadying assist  Assistive Device Front wheel walker  Distance Ambulated (ft) 144 ft  Mobility Ambulated with assistance in hallway  Mobility Response Tolerated well  Mobility performed by Mobility specialist  $Mobility charge 1 Mobility   Pre-Mobility: 78 HR, 121/71 BP, 97% SpO2 Post-Mobility: 82 HR, 109/84 BP, 99% SpO2  Pt found in bed and agreeable to ambulation. C/o tenderness in R heel. During walk was cautious of not using his R heel much. Returned to bed with call bell in reach.    [place] Mobility Specialist  Mobility Specialist Phone: [phone]

## 2021-07-27 ENCOUNTER — Inpatient Hospital Stay (HOSPITAL_COMMUNITY): Payer: Medicare Other

## 2021-07-27 DIAGNOSIS — I739 Peripheral vascular disease, unspecified: Secondary | ICD-10-CM | POA: Diagnosis not present

## 2021-07-27 LAB — CBC
HCT: 46.8 % (ref 39.0–52.0)
Hemoglobin: 15.8 g/dL (ref 13.0–17.0)
MCH: 29.9 pg (ref 26.0–34.0)
MCHC: 33.8 g/dL (ref 30.0–36.0)
MCV: 88.6 fL (ref 80.0–100.0)
Platelets: 209 10*3/uL (ref 150–400)
RBC: 5.28 MIL/uL (ref 4.22–5.81)
RDW: 13.3 % (ref 11.5–15.5)
WBC: 15.9 10*3/uL — ABNORMAL HIGH (ref 4.0–10.5)
nRBC: 0 % (ref 0.0–0.2)

## 2021-07-27 LAB — BASIC METABOLIC PANEL
Anion gap: 9 (ref 5–15)
BUN: 22 mg/dL (ref 8–23)
CO2: 23 mmol/L (ref 22–32)
Calcium: 9.5 mg/dL (ref 8.9–10.3)
Chloride: 106 mmol/L (ref 98–111)
Creatinine, Ser: 1.27 mg/dL — ABNORMAL HIGH (ref 0.61–1.24)
GFR, Estimated: >60 mL/min (ref >60)
Glucose, Bld: 166 mg/dL — ABNORMAL HIGH (ref 70–99)
Potassium: 3.8 mmol/L (ref 3.5–5.1)
Sodium: 138 mmol/L (ref 135–145)

## 2021-07-27 LAB — GLUCOSE, CAPILLARY
Glucose-Capillary: 173 mg/dL — ABNORMAL HIGH (ref 70–99)
Glucose-Capillary: 194 mg/dL — ABNORMAL HIGH (ref 70–99)
Glucose-Capillary: 160 mg/dL — ABNORMAL HIGH (ref 70–99)
Glucose-Capillary: 181 mg/dL — ABNORMAL HIGH (ref 70–99)

## 2021-07-27 LAB — PROTIME-INR
INR: 1.7 — ABNORMAL HIGH (ref 0.8–1.2)
Prothrombin Time: 19.8 seconds — ABNORMAL HIGH (ref 11.4–15.2)

## 2021-07-27 LAB — C-REACTIVE PROTEIN: CRP: 1.2 mg/dL — ABNORMAL HIGH

## 2021-07-27 LAB — SEDIMENTATION RATE: Sed Rate: 2 mm/hr (ref 0–16)

## 2021-07-27 LAB — MRSA NEXT GEN BY PCR, NASAL: MRSA by PCR Next Gen: DETECTED — AB

## 2021-07-27 MED ORDER — CHLORHEXIDINE GLUCONATE CLOTH 2 % EX PADS: 6.0000 | MEDICATED_PAD | Freq: Every day | CUTANEOUS | Status: DC

## 2021-07-27 MED ORDER — MUPIROCIN 2 % EX OINT
1.0000 "application " | TOPICAL_OINTMENT | Freq: Two times a day (BID) | CUTANEOUS | Status: DC
  Administered 2021-07-28: 1 via NASAL
  Filled 2021-07-27: qty 22

## 2021-07-27 MED ORDER — RIVAROXABAN 20 MG PO TABS: 20.0000 mg | ORAL_TABLET | Freq: Every day | ORAL | Status: DC

## 2021-07-27 MED ORDER — RIVAROXABAN 15 MG PO TABS
15.0000 mg | ORAL_TABLET | Freq: Two times a day (BID) | ORAL | Status: DC
  Administered 2021-07-27 – 2021-07-28 (×2): 15 mg via ORAL
  Filled 2021-07-27 (×2): qty 1

## 2021-07-27 MED ORDER — SODIUM CHLORIDE 0.9 % IV BOLUS
1000.0000 mL | Freq: Once | INTRAVENOUS | Status: AC
  Administered 2021-07-27: 1000 mL via INTRAVENOUS

## 2021-07-27 MED FILL — Heparin Sod (Porcine)-NaCl IV Soln 1000 Unit/500ML-0.9%: INTRAVENOUS | Qty: 1000 | Status: AC

## 2021-07-27 NOTE — Progress Notes (Signed)
Mobility Specialist Progress Note    07/27/21 1417  Mobility  Activity Ambulated in hall  Level of Assistance Contact guard assist, steadying assist  Assistive Device Front wheel walker  Distance Ambulated (ft) 252 ft  Mobility Ambulated with assistance in hallway  Mobility Response Tolerated well  Mobility performed by Mobility specialist  Bed Position Chair  $Mobility charge 1 Mobility   Pre-Mobility: 86 HR, 125/81 BP Post-Mobility: 82 HR, 149/100 BP  Pt received in chair and agreeable to ambulate. C/o tightness in RLE. Favored his R foot with a limp but not as much as yesterday. RLE  at heel looked slightly red and swollen compared to LLE at heel. Returned to chair with call bell in reach.   [place] Mobility Specialist  Mobility Specialist Phone: [phone]

## 2021-07-27 NOTE — Discharge Summary (Signed)
Name: Daniel Byrd  MRN: [id number] DOB: Oct 27, 1961 60 y.o. PCP: Sheela Stack  Date of Admission: 07/25/2021  2:37 PM Date of Discharge: 07/28/2021 Attending Physician: Dr. 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   amoxicillin-clavulanate 875-125 MG tablet Commonly known as: Augmentin Take 1 tablet by mouth 2 (two) times daily for 7 days.   Aspirin Low Dose 81 MG EC tablet Generic drug: aspirin Take 1 tablet (81 mg total) by mouth daily. Swallow whole. Start taking on: July 29, 2021   atorvastatin 80 MG tablet Commonly known as: LIPITOR Take 1 tablet (80 mg total) by mouth daily. Start taking on: July 29, 2021   doxycycline 100 MG tablet Commonly known as: VIBRA-TABS Take 1 tablet (100 mg total) by mouth 2 (two) times daily for 7 days.   gabapentin 300 MG capsule Commonly known as: NEURONTIN Take 300 mg by mouth 3 (three) times daily as needed (pain).   hydrochlorothiazide 25 MG tablet Commonly known as: HYDRODIURIL Take 1 tablet (25 mg total) by mouth daily.   Jardiance 25 MG Tabs tablet Generic drug: empagliflozin Take 25 mg by mouth daily.   lisinopril 10 MG tablet Commonly known as: ZESTRIL Take 10 mg by mouth daily.   metFORMIN 500 MG tablet Commonly known as: GLUCOPHAGE Take 1 tablet (500 mg total) by mouth 2 (two) times daily with a meal.   multivitamin with minerals Tabs tablet Take 1  tablet by mouth daily.   naproxen 500 MG tablet Commonly known as: NAPROSYN Take 1 tablet (500 mg total) by mouth 2 (two) times daily with a meal.   naproxen sodium 220 MG tablet Commonly known as: ALEVE Take 220 mg by mouth daily as needed (pain).   omeprazole 20 MG capsule Commonly known as: PRILOSEC Take 20 mg by mouth daily as needed (indigestion).               Discharge Care Instructions  (From admission, onward)           Start     Ordered   07/28/21 0000  Discharge wound care:       Comments: Keep area clean and dry.   07/28/21 1327   07/28/21 0000  No dressing needed        07/28/21 1327            Disposition and follow-up:   DanielTabias Byrd was discharged from [facility] in Stable condition. At the hospital follow up visit please address:  PAD from arterial thrombus Started on Lipitor 80 mg daily.  Vascular surgery placed multiple stents.  Lesion during angiogram is more consistent with an emboli then atherosclerotic plaque, started on Xarelto 15 mg BID for 21 days, then transitioning to Xarelto 20 mg daily. Will follow up with vascular surgeons outpatient, for Holter for possible A. fib. Will follow up with hematologist, for hypercoagulability work-up. T2DM. HbA1c 8.6 (  07/25/2021) On Jardiance and metformin at home, needs med management Right foot cellulitis DC'd on doxycycline 100mg  BID for 7 days p.o. and  Augmentin 875mg  BID p.o. for 7 days. He received 1 dose before leaving the hospital. Labs / imaging needed at time of follow-up: CBC, BMP Pending labs/ test needing follow-up: Echocardiogram  Follow-up Appointments:  Follow-up Information     Chuck Hint, MD Follow up.   Specialties: Vascular Surgery, Cardiology Contact information: [address] [place] [address] [phone]         Royann Shivers, [place] .   Specialty: Physician Assistant Contact information: [address] [place]  [place] [address] [phone]         [facility]. Go in 1 week(s).   Specialty: Internal Medicine Contact information: [address] [id number] mc [place] [address] [phone]               Hospital Course by problem list: Daniel Byrd is a 60 y.o. male with a pertinent PMH of type 2 diabetes and hypertension, who presented with worsening right foot pain and found to have critical limb ischemia in his right lower extremity and overlying cellulitis. LOS: 3 days  During hospitalization, vascular surgery was consulted, patient received multiple stents, and was started on broad-spectrum vancomycin and cefepime, for possible Pseudomonas coverage, for cellulitis.  Angiography revealed that lesion was more embolic rather than atherosclerotic, concern for possible A. Fib or hypercoagulable state. The patient was started on dual antiplatelet, Xarelto and ASA.  Right foot x-ray and MRI to rule out osteomyelitis.   Consults: Vascular surgery  Plan: #Severe PVD with likely critical limb ischemia and cellulitis Vascular surgery performed angiography, place stents, and angioplasty. Problematic lesion resembles embolism rather than sclerotic plaque. Will have patient follow up with vascular surgery outpatient with Holter for possible paroxysmal A. Fib, and hematology for hypercoagulable work-up.  Cellulitis improved with IV vancomycin and IV cefepime, for Pseudomonas coverage because of history of T2DM.  De-escalated to and discharged on doxycycline and Augmentin for 7 days. -Atorvastatin 80 mg daily for PAD -Dual antiplatelet therapy, for possible paroxysmal A. fib. ASA 81mg  and xarelto 15mg  BID for 21days, then transition to xarelto 20mg  daily -Follow-up with vascular surgery outpatient, for Holter monitor for possible paroxysmal A. Fib -Follow-up with hematologist for hypercoagulable work-up  #AKI-resolved Likely 2/2 contrast v cholesterol  emboli versus underlying infection while on lisinopril.  Patient received 1 L NS bolus and additional IV NS 100cc/h.  Last creatinine 1.16  #T2DM HbA1c 8.6 (07/27/2021), on Jardiance at home, patient does not know the dose.  -Will need to follow up with outpatient PCP for more optimal glycemic control.   #Essential hypertension Home lisinopril 10 mg.  Discharge Exam:   BP 130/73 (BP Location: Left Arm)   Pulse 89   Temp 97.9 F (36.6 C) (Oral)   Resp 14   Ht 5\' 9"  (1.753 m)   Wt 78.9 kg   SpO2 100%   BMI 25.70 kg/m   Physical Exam Vitals and nursing note reviewed.  Constitutional:      General: He is not in acute distress.    Appearance: He is not ill-appearing.  HENT:     Head: Normocephalic and atraumatic.  Cardiovascular:     Rate and Rhythm: Normal rate and regular rhythm.     Pulses: Normal pulses.     Heart sounds: Normal heart sounds.  Pulmonary:     Effort: Pulmonary effort is  normal.     Breath sounds: Normal breath sounds.  Abdominal:     General: Abdomen is flat.     Palpations: Abdomen is soft.  Musculoskeletal:        General: Swelling present. No tenderness. Normal range of motion.     Comments: Right foot is still mildly swollen, no pitting.  Still erythematous that is improving.  No longer warm. Able to bear weight on that right foot while ambulating with walker.  Skin:    General: Skin is warm and dry.  Neurological:     Mental Status: He is alert and oriented to person, place, and time.    Pertinent Labs, Studies, and Procedures:  DG Chest 2 View  Result Date: 07/25/2021 CLINICAL DATA:  Right-sided chest pain EXAM: CHEST - 2 VIEW COMPARISON:  12/21/2020 FINDINGS: The heart size and mediastinal contours are within normal limits. Both lungs are clear. The visualized skeletal structures are unremarkable. IMPRESSION: No active cardiopulmonary disease. Electronically Signed   By: Sharlet Salina M.D.   On: 07/25/2021 16:03   MR FOOT RIGHT WO  CONTRAST  Result Date: 07/27/2021 CLINICAL DATA:  Foot pain and swelling. Diabetic. EXAM: MRI OF THE RIGHT FOREFOOT WITHOUT CONTRAST TECHNIQUE: Multiplanar, multisequence MR imaging of the right foot was performed. No intravenous contrast was administered. COMPARISON:  Radiographs 07/26/2021 FINDINGS: The bony structures are intact. No findings suspicious for septic arthritis or osteomyelitis. Moderate degenerative changes at the first MTP joint. Mild diffuse subcutaneous soft tissue swelling/edema/fluid more notably around the ankle and suggesting cellulitis. No discrete fluid collection to suggest a drainable soft tissue abscess. Diffuse myofasciitis without definite findings for pyomyositis. The major tendons and ligaments are grossly intact. The tibiotalar and subtalar joints are maintained. The sinus tarsi is grossly normal. IMPRESSION: 1. Diffuse cellulitis and myofasciitis but no discrete drainable soft tissue abscess or pyomyositis. 2. No findings for septic arthritis or osteomyelitis. 3. Moderate degenerative changes at the first MTP joint. Electronically Signed   By: Rudie Meyer M.D.   On: 07/27/2021 19:37   PERIPHERAL VASCULAR CATHETERIZATION  Result Date: 07/26/2021 Images from the original result were not included. Patient name: Isack Lavalley MRN: [id number] DOB: Nov 30, 1960 Sex: male 07/26/2021 Pre-operative Diagnosis: Rutherford 5 critical limb ischemia in the right leg Post-operative diagnosis:  Same Surgeon:  Victorino Sparrow, MD Procedure Performed: 1.  Ultrasound-guided access of the left common femoral artery 2.  SFA stenting, 6 x 80 mm, 6 x 150 mm for chronic total occlusion 3.  3 x 150 mm angioplasty of the posterior tibial artery 4.  2.5 x 100 mm angioplasty of the medial plantar artery 5.  5 x 60 mm angioplasty of the popliteal artery 6.  Left common femoral artery closure using a ProGlide device 7.  Left common femoral diagnostic angiography 160 minutes of moderate sedation Indications:  Patient is a 60 year old male who presented to the hospital with Rutherford 5 critical limb ischemia.  He has nonhealing wounds on the first and second digits of his right foot with noted rest pain that has been present for a number of months.  Discussing the risk and benefits of angiography in an attempt to improve right lower extremity perfusion, Jule elected to proceed. Findings: Aortogram: Bilateral patent renal arteries, mild atherosclerotic disease of the infrarenal abdominal aorta On the right: normal common iliac artery, hypogastric artery, external iliac artery, common femoral artery, profunda.  The superficial femoral artery was occluded 4 cm from its ostia.  This reconstituted at the distal  SFA after 23cm. Distally there was atherosclerotic diease and flow-limiting stenosis of the popliteal artery.  There was single-vessel anterior tibial artery runoff with poor filling of the foot.  The tibioperoneal trunk was occluded.  There appeared to be constitution of the posterior tibial artery multiple points, however there was no flow to the foot. On the left: Normal common iliac artery, hypogastric artery, external iliac artery, common femoral artery, profunda, SFA  Procedure:  The patient was identified in the holding area and taken to room 8.  The patient was then placed supine on the table and prepped and draped in the usual sterile fashion.  A time out was called.  Ultrasound was used to evaluate the left common femoral artery.  It was patent .  A digital ultrasound image was acquired.  A micropuncture needle was used to access the left common femoral artery under ultrasound guidance.  An 018 wire was advanced without resistance and a micropuncture sheath was placed.  The 018 wire was removed and a benson wire was placed.  The micropuncture sheath was exchanged for a 5 french sheath.  An omniflush catheter was advanced over the wire to the level of L-1.  An abdominal angiogram was obtained.  Next, using  the omniflush catheter and a benson wire, the aortic bifurcation was crossed and the catheter was placed into theright external iliac artery and right runoff was obtained.  Left common femoral artery angiogram was performed at case completion to ensure no access complications. Findings as above Intervention: Patient was heparinized with 7000 units IV heparin and a 6 x 45 catheter was brought into the field and placed into the right common femoral arter. Next, using a series of wires and catheters, the 23 cm long superficial femoral artery occlusion was traversed.  The lesion was soft. True lumen was confirmed using angiography and the superficial femoral artery was stented using a 6 x 150, 6 x 80 mm self-expanding stents.  A 5 mm angioplasty balloon was used for dilation.  This demonstrated excellent result with resolution of the occlusion.  Next, our attention turned to the patient's single-vessel runoff.  The decision was made to try and recanalize the patient's posterior tibial artery.  Using a series of wires and catheters, the artery was traversed and wire placed in the medial plantar artery in the foot.   The lesion was soft concerning for thrombus. 150 x 3 mm balloon was brought to field and inflated along the entirety of the artery.  Completion angiography demonstrated excellent result however, continued to be no flow in the foot.  A 2.5 x 100 mm balloon was brought to the field and expanded in the medial plantar artery.  This demonstrated excellent result with recanalization of the pedal arch.  Prior to case completion, a 5 x 60 mm balloon was brought into the field and the P3 portion of the popliteal artery was angioplastied as a critical stenosis was appreciated. At case completion, the patient undergone stenting of the SFA, angioplasty of the popliteal artery, angioplasty of the posterior tibial artery, angioplasty of the lateral plantar artery.  He now has two-vessel runoff to the foot and patent pedal  arch. Patient's left common femoral artery was closed using a ProGlide device without issue. Fara Olden, MD [facility] Office: [phone] Plan: I will plan to see the patient in 2 weeks for wound care follow-up, new ABIs. Current antiplatelet regimen is dual antiplatelet therapy.  I would like him  to transitioned to Xarelto and single antiplatelet (aspirin) this evening, as the lesions in the right leg appeared to be thrombus rather than purely atherosclerotic. Patient does not have a history of A. fib, but would benefit from Holter monitor.  DG Foot Complete Right  Result Date: 07/26/2021 CLINICAL DATA:  Right foot paresthesia, osteomyelitis EXAM: RIGHT FOOT COMPLETE - 3+ VIEW COMPARISON:  None. FINDINGS: Three view radiograph right foot demonstrates normal alignment. No acute fracture or dislocation. No osseous erosions or abnormal periosteal reaction. 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 Remaining joint spaces are preserved. Large plantar calcaneal spur. Advanced vascular calcifications are noted within the right foot. Mild soft tissue swelling of the right forefoot. IMPRESSION: Mild forefoot soft tissue swelling. No focal osseous erosion to suggest osteomyelitis. Peripheral vascular disease. Electronically Signed   By: Helyn Numbers M.D.   On: 07/26/2021 20:37   ECHOCARDIOGRAM COMPLETE  Result Date: 07/28/2021    ECHOCARDIOGRAM REPORT   Patient Name:   XANDER JUTRAS Date of Exam: 07/28/2021 Medical Rec #:  [id number]      Height:       69.0 in Accession #:    [id number]     Weight:       174.0 lb Date of Birth:  10/24/1961      BSA:          1.947 m Patient Age:    60 years       BP:           161/84 mmHg Patient Gender: M              HR:           77 bpm. Exam Location:  Inpatient Procedure: 2D Echo, 3D Echo, Cardiac Doppler and Color Doppler Indications:    Other abnormalities of the heart R00.8  History:        Patient has no  prior history of Echocardiogram examinations.                 Risk Factors:Diabetes. Stenting of an occluded right superficial                 femoral artery in addition to angioplasty of the popliteal                 artery and posterior tibial artery.  Sonographer:    Leta Jungling RDCS Referring Phys: 75 CHRISTOPHER S DICKSON IMPRESSIONS  1. Left ventricular ejection fraction, by estimation, is 65 to 70%. The left ventricle has normal function. The left ventricle has no regional wall motion abnormalities. Left ventricular diastolic parameters are consistent with Grade I diastolic dysfunction (impaired relaxation).  2. Right ventricular systolic function is normal. The right ventricular size is normal.  3. The mitral valve is grossly normal. No evidence of mitral valve regurgitation.  4. The aortic valve is tricuspid. Aortic valve regurgitation is not visualized.  5. The inferior vena cava is normal in size with greater than 50% respiratory variability, suggesting right atrial pressure of 3 mmHg. Comparison(s): No prior Echocardiogram. FINDINGS  Left Ventricle: Left ventricular ejection fraction, by estimation, is 65 to 70%. The left ventricle has normal function. The left ventricle has no regional wall motion abnormalities. The left ventricular internal cavity size was normal in size. There is  no left ventricular hypertrophy. Left ventricular diastolic parameters are consistent with Grade I diastolic dysfunction (impaired relaxation). Indeterminate filling pressures. Right Ventricle: The right ventricular size is normal. No increase in right  ventricular wall thickness. Right ventricular systolic function is normal. Left Atrium: Left atrial size was normal in size. Right Atrium: Right atrial size was normal in size. Pericardium: There is no evidence of pericardial effusion. Mitral Valve: The mitral valve is grossly normal. No evidence of mitral valve regurgitation. Tricuspid Valve: The tricuspid valve is  grossly normal. Tricuspid valve regurgitation is trivial. Aortic Valve: The aortic valve is tricuspid. Aortic valve regurgitation is not visualized. Pulmonic Valve: The pulmonic valve was normal in structure. Pulmonic valve regurgitation is not visualized. Aorta: The aortic root and ascending aorta are structurally normal, with no evidence of dilitation. Venous: The inferior vena cava is normal in size with greater than 50% respiratory variability, suggesting right atrial pressure of 3 mmHg. IAS/Shunts: No atrial level shunt detected by color flow Doppler.  LEFT VENTRICLE PLAX 2D LVIDd:         3.60 cm  Diastology LVIDs:         2.10 cm  LV e' medial:    5.55 cm/s LV PW:         0.80 cm  LV E/e' medial:  13.3 LV IVS:        0.90 cm  LV e' lateral:   9.98 cm/s LVOT diam:     2.00 cm  LV E/e' lateral: 7.4 LV SV:         75 LV SV Index:   39 LVOT Area:     3.14 cm  RIGHT VENTRICLE RV S prime:     15.40 cm/s TAPSE (M-mode): 2.4 cm LEFT ATRIUM             Index       RIGHT ATRIUM           Index LA diam:        4.00 cm 2.05 cm/m  RA Area:     12.40 cm LA Vol (A2C):   14.8 ml 7.60 ml/m  RA Volume:   26.30 ml  13.51 ml/m LA Vol (A4C):   19.9 ml 10.22 ml/m LA Biplane Vol: 18.6 ml 9.55 ml/m  AORTIC VALVE LVOT Vmax:   147.00 cm/s LVOT Vmean:  93.350 cm/s LVOT VTI:    0.240 m  AORTA Ao Root diam: 3.00 cm Ao Asc diam:  2.90 cm MITRAL VALVE MV Area (PHT): 3.54 cm     SHUNTS MV Decel Time: 214 msec     Systemic VTI:  0.24 m MV E velocity: 73.80 cm/s   Systemic Diam: 2.00 cm MV A velocity: 101.00 cm/s MV E/A ratio:  0.73 Zoila Shutter MD Electronically signed by Zoila Shutter MD Signature Date/Time: 07/28/2021/12:17:03 PM    Final    VAS [place] LOWER EXTREMITY ARTERIAL DUPLEX (ONLY [facility] & [facility] 7a-7p)  Result Date: 07/26/2021 LOWER EXTREMITY ARTERIAL DUPLEX STUDY Patient Name:  JERMIAH SODERMAN  Date of Exam:   07/25/2021 Medical Rec #: [id number]       Accession #:    [id number] Date of Birth: Dec 18, 1960       Patient Gender: M  Patient Age:   89 years Exam Location:  [facility] Procedure:      VAS [place] LOWER EXTREMITY ARTERIAL DUPLEX Referring Phys: GRACE LOEFFLER --------------------------------------------------------------------------------  Indications: Rest pain, ulceration, and peripheral artery disease. High Risk Factors: Diabetes.  Current ABI: Right= unable to be obtained, left= noncompressible Comparison Study: No prior study Performing Technologist: Gertie Fey MHA, RDMS, RVT, RDCS  Examination Guidelines: A complete evaluation includes B-mode imaging, spectral Doppler, color Doppler, and  power Doppler as needed of all accessible portions of each vessel. Bilateral testing is considered an integral part of a complete examination. Limited examinations for reoccurring indications may be performed as noted.  +-----------+--------+-----+---------------+------------------+----------------+ RIGHT      PSV cm/sRatioStenosis       Waveform          Comments         +-----------+--------+-----+---------------+------------------+----------------+ CFA Distal 159                         triphasic                          +-----------+--------+-----+---------------+------------------+----------------+ DFA        133                         triphasic                          +-----------+--------+-----+---------------+------------------+----------------+ SFA Prox   185          30-49% stenosismonophasic        Occluded just                                                             distal to this                                                            sample           +-----------+--------+-----+---------------+------------------+----------------+ SFA Mid    441          75-99% stenosismonophasic                         +-----------+--------+-----+---------------+------------------+----------------+ SFA Distal 62                          monophasic                          +-----------+--------+-----+---------------+------------------+----------------+ POP Prox   29                          Severely damepened                                                        monophasic                         +-----------+--------+-----+---------------+------------------+----------------+ POP Distal 52                          monophasic                         +-----------+--------+-----+---------------+------------------+----------------+  TP Trunk   53                          monophasic                         +-----------+--------+-----+---------------+------------------+----------------+ ATA Prox   98                          monophasic                         +-----------+--------+-----+---------------+------------------+----------------+ ATA Mid    30                          dampened                                                                  monophasic                         +-----------+--------+-----+---------------+------------------+----------------+ ATA Distal 27                          Severely damepened                                                        monophasic                         +-----------+--------+-----+---------------+------------------+----------------+ PTA Prox   20                          dampened                                                                  monophasic                         +-----------+--------+-----+---------------+------------------+----------------+ PTA Mid    11                          Severely dampened                                                         monophasic                         +-----------+--------+-----+---------------+------------------+----------------+ PTA Distal 18  Severely damepened                                                        monophasic                          +-----------+--------+-----+---------------+------------------+----------------+ PERO Distal             occluded                                          +-----------+--------+-----+---------------+------------------+----------------+ DP         16                          Severely damepened                                                        monophasic                         +-----------+--------+-----+---------------+------------------+----------------+ A focal velocity elevation of 441 cm/s was obtained at Right SFA mid with a VR of 55.1. Findings are characteristic of 75-99% stenosis.  Summary: Right: 75-99% stenosis noted in the superficial femoral artery. Total occlusion noted in the peroneal artery.  See table(s) above for measurements and observations. Electronically signed by Heath Lark on 07/26/2021 at 5:31:20 PM.    Final    VAS [place] LOWER EXTREMITY VENOUS (DVT) (ONLY [facility] & [facility] 7a-7p)  Result Date: 07/26/2021  Lower Venous DVT Study Patient Name:  LIEF PALMATIER  Date of Exam:   07/25/2021 Medical Rec #: [id number]       Accession #:    [id number] Date of Birth: August 18, 1961       Patient Gender: M Patient Age:   16 years Exam Location:  [facility] Procedure:      VAS [place] LOWER EXTREMITY VENOUS (DVT) Referring Phys: GRACE LOEFFLER --------------------------------------------------------------------------------  Indications: Edema.  Comparison Study: No prior study Performing Technologist: Gertie Fey MHA, RDMS, RVT, RDCS  Examination Guidelines: A complete evaluation includes B-mode imaging, spectral Doppler, color Doppler, and power Doppler as needed of all accessible portions of each vessel. Bilateral testing is considered an integral part of a complete examination. Limited examinations for reoccurring indications may be performed as noted. The reflux portion of the exam is performed with the patient in reverse Trendelenburg.   +---------+---------------+---------+-----------+----------+--------------+ RIGHT    CompressibilityPhasicitySpontaneityPropertiesThrombus Aging +---------+---------------+---------+-----------+----------+--------------+ CFV      Full           Yes      Yes                                 +---------+---------------+---------+-----------+----------+--------------+ SFJ      Full                                                        +---------+---------------+---------+-----------+----------+--------------+  FV Prox  Full                                                        +---------+---------------+---------+-----------+----------+--------------+ FV Mid   Full                                                        +---------+---------------+---------+-----------+----------+--------------+ FV DistalFull                                                        +---------+---------------+---------+-----------+----------+--------------+ PFV      Full                                                        +---------+---------------+---------+-----------+----------+--------------+ POP      Full           Yes      Yes                                 +---------+---------------+---------+-----------+----------+--------------+ PTV      Full                                                        +---------+---------------+---------+-----------+----------+--------------+ PERO     Full                                                        +---------+---------------+---------+-----------+----------+--------------+     Summary: RIGHT: - There is no evidence of deep vein thrombosis in the lower extremity.  - No cystic structure found in the popliteal fossa.   *See table(s) above for measurements and observations. Electronically signed by Heath Lark on 07/26/2021 at 5:31:38 PM.    Final      Discharge Instructions:   Discharge Instructions      Dear Mr.  Antrim, I am so glad you are feeling better and can be discharged today! You were admitted because of right foot pain caused by a clot in your leg artery and skin infection on top.   Please see the following instructions: For your: See your primary care doctor within 1 week to make sure that everything is going in the right direction. Also see the following specialist: Vascular surgery and hematologist Right foot pain from leg clots Please continue your cholesterol medicine (atorvastatin 80 mg daily) and your blood thinners (aspirin 81 mg daily and Xarelto 15 mg twice daily) Xarelto 15 mg twice  daily for 21 days.  Then change to Xarelto 20 mg daily. You will also need to see the vascular surgeon/cardiologist for a heart monitor (Holter), to see how your heart is beating.  We are concerned for possible irregular heartbeat that occurs only sometimes, that can cause blood clots. Please also make an appointment with a hematologist (blood doctor) for a work-up, for possible cause of the clot in her leg. Right foot skin infection Please continue your antibiotics until October 7. Antibiotics are Augmentin 875 mg twice daily and doxycycline 100 mg twice daily (morning and night). DO NOT MISS ANY DOSES, BECAUSE INFECTION CAN COME BACK. Type 2 diabetes.  Currently your diabetes is not well controlled, that hemoglobin A1c is 8.6.  Our goal for you is to have an A1c of less than 7. Please see your primary care doctor to optimize your diabetes medicine regiment. Please take your diabetes medicine daily, and check your blood sugar at least twice a day.  1 time in the morning before food, and 1 time at night after dinner. High blood pressure Please continue taking your lisinopril 10 mg daily, and check your blood pressure around 3-4 times a week.  It was a pleasure meeting you, Mr. Schoon.  I wish you and your family the best, and hope you stay happy and healthy!    Information on my medicine - XARELTO  (rivaroxaban)  This medication education was reviewed with me or my healthcare representative as part of my discharge preparation.  WHY WAS XARELTO PRESCRIBED FOR YOU? Xarelto was prescribed to treat blood clots that may have been found in the veins of your legs (deep vein thrombosis) or in your lungs (pulmonary embolism) and to reduce the risk of them occurring again.  What do you need to know about Xarelto? The starting dose is one 15 mg tablet taken TWICE daily with food for the FIRST 21 DAYS then on 08/16/2021  the dose is changed to one 20 mg tablet taken ONCE A DAY with your evening meal.  DO NOT stop taking Xarelto without talking to the health care provider who prescribed the medication.  Refill your prescription for 20 mg tablets before you run out.  After discharge, you should have regular check-up appointments with your healthcare provider that is prescribing your Xarelto.  In the future your dose may need to be changed if your kidney function changes by a significant amount.  What do you do if you miss a dose? If you are taking Xarelto TWICE DAILY and you miss a dose, take it as soon as you remember. You may take two 15 mg tablets (total 30 mg) at the same time then resume your regularly scheduled 15 mg twice daily the next day.  If you are taking Xarelto ONCE DAILY and you miss a dose, take it as soon as you remember on the same day then continue your regularly scheduled once daily regimen the next day. Do not take two doses of Xarelto at the same time.   Important Safety Information Xarelto is a blood thinner medicine that can cause bleeding. You should call your healthcare provider right away if you experience any of the following: Bleeding from an injury or your nose that does not stop. Unusual colored urine (red or dark brown) or unusual colored stools (red or black). Unusual bruising for unknown reasons. A serious fall or if you hit your head (even if there is no  bleeding).  Some medicines may interact with Xarelto and  might increase your risk of bleeding while on Xarelto. To help avoid this, consult your healthcare provider or pharmacist prior to using any new prescription or non-prescription medications, including herbals, vitamins, non-steroidal anti-inflammatory drugs (NSAIDs) and supplements.  This website has more information on Xarelto: [email or web address].       Signed: Princess Bruins, DO Psychiatry Resident, PGY-1 [facility] Teaching Service 07/28/2021, 8:18 PM   Pager: [phone]

## 2021-07-27 NOTE — Progress Notes (Addendum)
   VASCULAR SURGERY ASSESSMENT & PLAN:   POD 1 ENDOVASCULAR REVASCULARIZATION RIGHT LOWER EXTREMITY: The patient underwent angioplasty and stenting of an occluded right superficial femoral artery in addition to angioplasty of the popliteal artery and posterior tibial artery.  He has had an excellent result with a hyperemic foot.  Based on the history and findings at the time of his arteriogram certainly embolization is of the differential diagnosis and Dr. Sherral Hammers has recommended full dose Xarelto in addition to aspirin.  He will also need an echo to rule out a cardiac source for embolization.  I had ordered his echo.  He is on Xarelto and aspirin.  SUBJECTIVE:   His right foot feels much better.  PHYSICAL EXAM:   Vitals:   07/26/21 2348 07/27/21 0345 07/27/21 0750 07/27/21 1139  BP: 130/82 134/81 (!) 154/77 (!) 152/89  Pulse: 72 71 83 76  Resp: 11  20 19   Temp: 97.9 F (36.6 C) 97.7 F (36.5 C) 97.8 F (36.6 C) 98.1 F (36.7 C)  TempSrc: Oral Oral Oral Oral  SpO2: 97% 100% 100% 100%  Weight:      Height:       Brisk Doppler signals in the right foot with a hyperemic right foot. No hematoma left groin.  LABS:   Lab Results  Component Value Date   WBC 15.9 (H) 07/27/2021   HGB 15.8 07/27/2021   HCT 46.8 07/27/2021   MCV 88.6 07/27/2021   PLT 209 07/27/2021   Lab Results  Component Value Date   CREATININE 1.27 (H) 07/27/2021   Lab Results  Component Value Date   INR 1.7 (H) 07/27/2021   CBG (last 3)  Recent Labs    07/26/21 2050 07/27/21 0650 07/27/21 1134  GLUCAP 177* 173* 194*    PROBLEM LIST:    Principal Problem:   Severe peripheral arterial disease (HCC) Active Problems:   Type 2 diabetes mellitus (HCC)   Benign essential HTN   CURRENT MEDS:    acetaminophen  1,000 mg Oral Q8H   Or   acetaminophen  650 mg Rectal Q8H   aspirin EC  81 mg Oral Daily   atorvastatin  80 mg Oral Daily   insulin aspart  0-15 Units Subcutaneous TID WC   insulin  aspart  0-5 Units Subcutaneous QHS   lisinopril  10 mg Oral Daily   oxyCODONE-acetaminophen  1 tablet Oral Q6H   Rivaroxaban  15 mg Oral BID WC   Followed by   07/29/21 ON 08/16/2021] rivaroxaban  20 mg Oral Q supper   sodium chloride flush  3 mL Intravenous Q12H    08/18/2021 Office: [phone] 07/27/2021

## 2021-07-27 NOTE — Discharge Instructions (Addendum)
Dear Daniel Byrd, I am so glad you are feeling better and can be discharged today! You were admitted because of right foot pain caused by a clot in your leg artery and skin infection on top.   Please see the following instructions: For your: See your primary care doctor within 1 week to make sure that everything is going in the right direction. Also see the following specialist: Vascular surgery and hematologist Right foot pain from leg clots Please continue your cholesterol medicine (atorvastatin 80 mg daily) and your blood thinners (aspirin 81 mg daily and Xarelto 15 mg twice daily) Xarelto 15 mg twice daily for 21 days.  Then change to Xarelto 20 mg daily. You will also need to see the vascular surgeon/cardiologist for a heart monitor (Holter), to see how your heart is beating.  We are concerned for possible irregular heartbeat that occurs only sometimes, that can cause blood clots. Please also make an appointment with a hematologist (blood doctor) for a work-up, for possible cause of the clot in her leg. Right foot skin infection Please continue your antibiotics until October 7. Antibiotics are Augmentin 875 mg twice daily and doxycycline 100 mg twice daily (morning and night). DO NOT MISS ANY DOSES, BECAUSE INFECTION CAN COME BACK. Type 2 diabetes.  Currently your diabetes is not well controlled, that hemoglobin A1c is 8.6.  Our goal for you is to have an A1c of less than 7. Please see your primary care doctor to optimize your diabetes medicine regiment. Please take your diabetes medicine daily, and check your blood sugar at least twice a day.  1 time in the morning before food, and 1 time at night after dinner. High blood pressure Please continue taking your lisinopril 10 mg daily, and check your blood pressure around 3-4 times a week.  It was a pleasure meeting you, Daniel Byrd.  I wish you and your family the best, and hope you stay happy and healthy!    Information on my medicine -  XARELTO (rivaroxaban)  This medication education was reviewed with me or my healthcare representative as part of my discharge preparation.  WHY WAS XARELTO PRESCRIBED FOR YOU? Xarelto was prescribed to treat blood clots that may have been found in the veins of your legs (deep vein thrombosis) or in your lungs (pulmonary embolism) and to reduce the risk of them occurring again.  What do you need to know about Xarelto? The starting dose is one 15 mg tablet taken TWICE daily with food for the FIRST 21 DAYS then on 08/16/2021  the dose is changed to one 20 mg tablet taken ONCE A DAY with your evening meal.  DO NOT stop taking Xarelto without talking to the health care provider who prescribed the medication.  Refill your prescription for 20 mg tablets before you run out.  After discharge, you should have regular check-up appointments with your healthcare provider that is prescribing your Xarelto.  In the future your dose may need to be changed if your kidney function changes by a significant amount.  What do you do if you miss a dose? If you are taking Xarelto TWICE DAILY and you miss a dose, take it as soon as you remember. You may take two 15 mg tablets (total 30 mg) at the same time then resume your regularly scheduled 15 mg twice daily the next day.  If you are taking Xarelto ONCE DAILY and you miss a dose, take it as soon as you remember on the same  day then continue your regularly scheduled once daily regimen the next day. Do not take two doses of Xarelto at the same time.   Important Safety Information Xarelto is a blood thinner medicine that can cause bleeding. You should call your healthcare provider right away if you experience any of the following: Bleeding from an injury or your nose that does not stop. Unusual colored urine (red or dark brown) or unusual colored stools (red or black). Unusual bruising for unknown reasons. A serious fall or if you hit your head (even if there  is no bleeding).  Some medicines may interact with Xarelto and might increase your risk of bleeding while on Xarelto. To help avoid this, consult your healthcare provider or pharmacist prior to using any new prescription or non-prescription medications, including herbals, vitamins, non-steroidal anti-inflammatory drugs (NSAIDs) and supplements.  This website has more information on Xarelto: VisitDestination.[email or web address].br.

## 2021-07-27 NOTE — Progress Notes (Addendum)
HD 2 SUBJECTIVE:  Patient Summary: Daniel Byrd is a 60 y.o. male with a pertinent PMH of type 2 diabetes and hypertension, who presented with worsening right foot pain and found to have critical limb ischemia in his right lower extremity.  Overnight Events: None  Patient states that his foot pain has improved today patient has some persistent numbness in the right foot as well as some mild right heel pain  OBJECTIVE:  Vital Signs: Vitals:   07/26/21 2348 07/27/21 0345 07/27/21 0750 07/27/21 1139  BP: 130/82 134/81 (!) 154/77 (!) 152/89  Pulse: 72 71 83 76  Resp: 11  20 19   Temp: 97.9 F (36.6 C) 97.7 F (36.5 C) 97.8 F (36.6 C) 98.1 F (36.7 C)  TempSrc: Oral Oral Oral Oral  SpO2: 97% 100% 100% 100%  Weight:      Height:       Supplemental O2: Room Air SpO2: 100 %  Filed Weights   07/25/21 1429  Weight: 78.9 kg     Intake/Output Summary (Last 24 hours) at 07/27/2021 1359 Last data filed at 07/27/2021 1317 Gross per 24 hour  Intake 1950.43 ml  Output 400 ml  Net 1550.43 ml    Net IO Since Admission: 1,851.09 mL [07/27/21 1359]  Physical Exam: Physical Exam Vitals and nursing note reviewed.  Constitutional:      General: He is not in acute distress.    Appearance: He is not ill-appearing or diaphoretic.  HENT:     Head: Normocephalic and atraumatic.  Eyes:     Extraocular Movements: Extraocular movements intact.  Cardiovascular:     Rate and Rhythm: Normal rate and regular rhythm.     Pulses: Normal pulses.     Heart sounds: Normal heart sounds.  Pulmonary:     Effort: Pulmonary effort is normal.     Breath sounds: Normal breath sounds.  Abdominal:     General: Abdomen is flat.     Palpations: Abdomen is soft.  Musculoskeletal:        General: Normal range of motion.     Cervical back: Normal range of motion.     Left lower leg: Normal.     Comments: Left LE is normal. Right lower leg: Shiny, decreased hair, skin is warm, multiple lesions on  anterior tibia Right foot: Lesion with central eschar on third digit of right foot; Improving swelling and erythema of foot.   Skin:    General: Skin is warm and dry.  Neurological:     Mental Status: He is alert and oriented to person, place, and time.  Psychiatric:        Mood and Affect: Mood normal.    Pertinent Labs: CBC Latest Ref Rng & Units 07/27/2021 07/26/2021 07/25/2021  WBC 4.0 - 10.5 K/uL 15.9(H) 14.3(H) 12.5(H)  Hemoglobin 13.0 - 17.0 g/dL 07/27/2021 18.5 63.1  Hematocrit 39.0 - 52.0 % 46.8 48.2 48.9  Platelets 150 - 400 K/uL 209 192 201    CMP Latest Ref Rng & Units 07/27/2021 07/26/2021 07/25/2021  Glucose 70 - 99 mg/dL 07/27/2021) 026(V) 785(Y)  BUN 8 - 23 mg/dL 22 19 850(Y)  Creatinine 0.61 - 1.24 mg/dL 77(A) 1.28(N 8.67  Sodium 135 - 145 mmol/L 138 138 139  Potassium 3.5 - 5.1 mmol/L 3.8 3.5 4.4  Chloride 98 - 111 mmol/L 106 108 106  CO2 22 - 32 mmol/L 23 22 24   Calcium 8.9 - 10.3 mg/dL 9.5 9.1 9.5  Total Protein 6.5 - 8.1 g/dL - - -  Total Bilirubin 0.3 - 1.2 mg/dL - - -  Alkaline Phos 38 - 126 U/L - - -  AST 15 - 41 U/L - - -  ALT 0 - 44 U/L - - -    Recent Labs    07/26/21 2050 07/27/21 0650 07/27/21 1134  GLUCAP 177* 173* 194*     Pertinent Imaging: DG Foot Complete Right  Result Date: 07/26/2021 CLINICAL DATA:  Right foot paresthesia, osteomyelitis EXAM: RIGHT FOOT COMPLETE - 3+ VIEW COMPARISON:  None. FINDINGS: Three view radiograph right foot demonstrates normal alignment. No acute fracture or dislocation. No osseous erosions or abnormal periosteal reaction. 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 Remaining joint spaces are preserved. Large plantar calcaneal spur. Advanced vascular calcifications are noted within the right foot. Mild soft tissue swelling of the right forefoot. IMPRESSION: Mild forefoot soft tissue swelling. No focal osseous erosion to suggest osteomyelitis. Peripheral vascular disease. Electronically Signed   By:  Helyn Numbers M.D.   On: 07/26/2021 20:37    ASSESSMENT/PLAN:  Assessment: Principal Problem:   Severe peripheral arterial disease (HCC) Active Problems:   Type 2 diabetes mellitus (HCC)   Benign essential HTN  Daniel Byrd is a 60 y.o. male with a pertinent PMH of type 2 diabetes and hypertension, who presented with worsening right foot pain and found to have critical limb ischemia in his right lower extremity.  HD 2  Plan: #Severe PVD with likely critical limb ischemia and cellulitis Right leg angiography, stents, angioplasty yesterday by vascular surgery. Lesions in the right leg appeared to be thrombus rather than atherosclerotic.  Has no history of A. fib, however to help we will have patient follow up with Holter monitor outpatient, a lot of medications for while inpatient will receive ECHO.  Starting patient on Xarelto and ASA. He is afebrile, but has upward trending leukocytosis. Will continue broad-spectrum antibiotic coverage vancomycin and cefepime. Right foot x-ray showed no evidence of osteomyelitis, however.  Because of elevated CRP, right foot pain, overlying cellulitis with eschar extending to the bone over the right third toe in the setting of PVD we will obtain MRI of his foot to rule out osteomyelitis.  Vascular surgery is on board, appreciate recommendations and assistance. -Continue atorvastatin 80 mg daily -Continue ASA and xarelto -Continue current pain regimen -Continue vancomycin and cefepime for now.  Will consider de-escalating antibiotics tomorrow to Augmentin and doxycycline -Follow-up CBC and BMP -Follow-up right foot MRI to rule out osteomyelitis -Follow-up echocardiogram to rule out thrombus leading to right lower extremity clot  #AKI Likely 2/2 contrast v cholesterol emboli versus underlying infection. Cr 1.27 from Cr 0.97. Patient received 1 L NS bolus this AM.  -Continue IV NS 100 cc/h -Trend Cr  #T2DM HbA1c 8.6 (07/27/2021), on Jardiance at home,  patient does not know the dose. Fasting glucose 122 this a.m. -Continue trending CBG -Continue moderate SSI for glycemic control -Will need to follow up with outpatient PCP for more optimal glycemic control.  #Essential hypertension -Continue home lisinopril 10 mg.  We will hold lisinopril if creatinine continues to worsen  Best Practice: Diet:  Heart Healthy/Carb Mod IVF: NS,100cc/hr VTE:  Xarelto Code: Full Code  PT/OT recs: Pending, none. TOC recs: Pending  Prior to Admission Living Arrangement: Home  Anticipated Discharge Location: Home Barriers to Discharge: Medical management  Dispo: Anticipated discharge tomorrow.  Signature: Princess Bruins, D.O. Psychiatry Resident, PGY-1 [facility] Teaching Service Pager: [phone] 1:59 PM, 07/27/2021   Please contact the on  call pager after 5 pm and on weekends at [phone].

## 2021-07-28 ENCOUNTER — Other Ambulatory Visit: Payer: Self-pay

## 2021-07-28 ENCOUNTER — Inpatient Hospital Stay (HOSPITAL_COMMUNITY): Payer: Medicare Other

## 2021-07-28 ENCOUNTER — Other Ambulatory Visit (HOSPITAL_COMMUNITY): Payer: Self-pay

## 2021-07-28 DIAGNOSIS — R008 Other abnormalities of heart beat: Secondary | ICD-10-CM

## 2021-07-28 DIAGNOSIS — I739 Peripheral vascular disease, unspecified: Secondary | ICD-10-CM

## 2021-07-28 LAB — CBC
HCT: 46 % (ref 39.0–52.0)
Hemoglobin: 15.6 g/dL (ref 13.0–17.0)
MCH: 29.9 pg (ref 26.0–34.0)
MCHC: 33.9 g/dL (ref 30.0–36.0)
MCV: 88.1 fL (ref 80.0–100.0)
Platelets: 200 10*3/uL (ref 150–400)
RBC: 5.22 MIL/uL (ref 4.22–5.81)
RDW: 13.1 % (ref 11.5–15.5)
WBC: 15.9 10*3/uL — ABNORMAL HIGH (ref 4.0–10.5)
nRBC: 0 % (ref 0.0–0.2)

## 2021-07-28 LAB — BASIC METABOLIC PANEL
Anion gap: 11 (ref 5–15)
BUN: 26 mg/dL — ABNORMAL HIGH (ref 8–23)
CO2: 18 mmol/L — ABNORMAL LOW (ref 22–32)
Calcium: 9.4 mg/dL (ref 8.9–10.3)
Chloride: 109 mmol/L (ref 98–111)
Creatinine, Ser: 1.16 mg/dL (ref 0.61–1.24)
GFR, Estimated: >60 mL/min (ref >60)
Glucose, Bld: 138 mg/dL — ABNORMAL HIGH (ref 70–99)
Potassium: 3.7 mmol/L (ref 3.5–5.1)
Sodium: 138 mmol/L (ref 135–145)

## 2021-07-28 LAB — GLUCOSE, CAPILLARY
Glucose-Capillary: 137 mg/dL — ABNORMAL HIGH (ref 70–99)
Glucose-Capillary: 329 mg/dL — ABNORMAL HIGH (ref 70–99)

## 2021-07-28 LAB — ECHOCARDIOGRAM COMPLETE
Area-P 1/2: 3.54 cm2
Height: 69 in
S' Lateral: 2.1 cm
Weight: 2784 [oz_av]

## 2021-07-28 MED ORDER — OXYCODONE HCL 5 MG PO TABS
5.0000 mg | ORAL_TABLET | Freq: Four times a day (QID) | ORAL | Status: DC | PRN
  Administered 2021-07-28: 5 mg via ORAL
  Filled 2021-07-28: qty 1

## 2021-07-28 MED ORDER — ASPIRIN 81 MG PO TBEC
81.0000 mg | DELAYED_RELEASE_TABLET | Freq: Every day | ORAL | 11 refills | Status: AC
  Filled 2021-07-28: qty 30, 30d supply, fill #0

## 2021-07-28 MED ORDER — DOXYCYCLINE HYCLATE 100 MG PO TABS
100.0000 mg | ORAL_TABLET | Freq: Two times a day (BID) | ORAL | Status: DC
  Administered 2021-07-28: 100 mg via ORAL
  Filled 2021-07-28: qty 1

## 2021-07-28 MED ORDER — DOXYCYCLINE HYCLATE 100 MG PO TABS
100.0000 mg | ORAL_TABLET | Freq: Two times a day (BID) | ORAL | 0 refills | Status: AC
  Filled 2021-07-28: qty 14, 7d supply, fill #0

## 2021-07-28 MED ORDER — AMOXICILLIN-POT CLAVULANATE 875-125 MG PO TABS
1.0000 | ORAL_TABLET | Freq: Two times a day (BID) | ORAL | Status: DC
  Administered 2021-07-28: 1 via ORAL
  Filled 2021-07-28: qty 1

## 2021-07-28 MED ORDER — OXYCODONE HCL 5 MG PO TABS: 5.0000 mg | ORAL_TABLET | Freq: Four times a day (QID) | ORAL | Status: DC

## 2021-07-28 MED ORDER — ATORVASTATIN CALCIUM 80 MG PO TABS
80.0000 mg | ORAL_TABLET | Freq: Every day | ORAL | 0 refills | Status: DC
  Filled 2021-07-28: qty 30, 30d supply, fill #0

## 2021-07-28 MED ORDER — AMOXICILLIN-POT CLAVULANATE 875-125 MG PO TABS
1.0000 | ORAL_TABLET | Freq: Two times a day (BID) | ORAL | 0 refills | Status: AC
  Filled 2021-07-28: qty 14, 7d supply, fill #0

## 2021-07-28 NOTE — Progress Notes (Signed)
  Echocardiogram 2D Echocardiogram has been performed.  Daniel Byrd 07/28/2021, 10:33 AM

## 2021-07-28 NOTE — Care Management Important Message (Signed)
Important Message  Patient Details  Name: Daniel Byrd MRN: [id number] Date of Birth: September 21, 1961   Medicare Important Message Given:  Yes     Renie Ora 07/28/2021, 9:49 AM

## 2021-07-28 NOTE — Progress Notes (Signed)
Mobility Specialist Progress Note    07/28/21 1110  Mobility  Activity Ambulated in hall  Level of Assistance Standby assist, set-up cues, supervision of patient - no hands on  Assistive Device Front wheel walker  Distance Ambulated (ft) 204 ft  Mobility Ambulated with assistance in hallway  Mobility Response Tolerated well  Mobility performed by Mobility specialist  $Mobility charge 1 Mobility   Post-Mobility: 102 HR  Received pt waiting at door. C/o LLE numbness and calve tightness but minimal pain. Limping minimal.  [place] Mobility Specialist  Mobility Specialist Phone: [phone]

## 2021-07-28 NOTE — Progress Notes (Signed)
   VASCULAR SURGERY ASSESSMENT & PLAN:   POD 2 ENDOVASCULAR REVASCULARIZATION RIGHT LOWER EXTREMITY: The patient underwent angioplasty and stenting of an occluded right superficial femoral artery in addition to angioplasty of the popliteal artery and posterior tibial artery.  He has had an excellent result with a hyperemic foot.    CARDIAC: An echo was ordered in order to rule out a cardiac source for embolization.  Reportedly they are coming to get him now to do the test.    He is on Xarelto and aspirin.  He is okay for discharge from our standpoint.  I will arrange follow-up with our office.   SUBJECTIVE:   No specific implants this morning.  He is anxious to go home before the storm gets here.  PHYSICAL EXAM:   Vitals:   07/28/21 0345 07/28/21 0400 07/28/21 0500 07/28/21 0814  BP: (!) 148/86 (!) 144/75 (!) 142/84 (!) 161/84  Pulse: 76 72 75 77  Resp: 18     Temp: 97.7 F (36.5 C)   97.8 F (36.6 C)  TempSrc: Oral   Oral  SpO2: 98% 95% 96% 100%  Weight:      Height:       The right foot is hyperemic with a brisk biphasic posterior tibial and dorsalis pedis signal with a Doppler. The wounds of the right foot are unchanged.  LABS:   Lab Results  Component Value Date   WBC 15.9 (H) 07/28/2021   HGB 15.6 07/28/2021   HCT 46.0 07/28/2021   MCV 88.1 07/28/2021   PLT 200 07/28/2021   Lab Results  Component Value Date   CREATININE 1.16 07/28/2021   Lab Results  Component Value Date   INR 1.7 (H) 07/27/2021   CBG (last 3)  Recent Labs    07/27/21 1709 07/27/21 2116 07/28/21 0555  GLUCAP 160* 181* 137*    PROBLEM LIST:    Principal Problem:   Severe peripheral arterial disease (HCC) Active Problems:   Type 2 diabetes mellitus (HCC)   Benign essential HTN   CURRENT MEDS:    acetaminophen  1,000 mg Oral Q8H   Or   acetaminophen  650 mg Rectal Q8H   aspirin EC  81 mg Oral Daily   atorvastatin  80 mg Oral Daily   Chlorhexidine Gluconate Cloth  6 each  Topical Q0600   insulin aspart  0-15 Units Subcutaneous TID WC   insulin aspart  0-5 Units Subcutaneous QHS   lisinopril  10 mg Oral Daily   mupirocin ointment  1 application Nasal BID   Rivaroxaban  15 mg Oral BID WC   Followed by   Melene Muller ON 08/16/2021] rivaroxaban  20 mg Oral Q supper   sodium chloride flush  3 mL Intravenous Q12H    Waverly Ferrari Office: [phone] 07/28/2021

## 2021-07-28 NOTE — Progress Notes (Signed)
D/c tele and IV. Went over AVS with pt and his sister-in-law and all questions were answered.   Lawson Radar, RN

## 2021-08-03 ENCOUNTER — Encounter: Payer: Medicare Other | Admitting: Internal Medicine

## 2021-08-11 ENCOUNTER — Encounter (HOSPITAL_COMMUNITY): Payer: Medicare Other

## 2021-08-11 ENCOUNTER — Encounter: Payer: Medicare Other | Admitting: Vascular Surgery

## 2021-08-28 ENCOUNTER — Other Ambulatory Visit: Payer: Self-pay | Admitting: *Deleted

## 2021-08-28 DIAGNOSIS — I739 Peripheral vascular disease, unspecified: Secondary | ICD-10-CM

## 2021-08-31 NOTE — Progress Notes (Signed)
Office Note    HPI: Daniel Byrd is a 60 y.o. (1961/05/05) male presenting in follow-up status post 07/26/2021 Right SFA stenting, popliteal artery, posterior tibial artery, peroneal artery, medial plantar artery balloon angioplasty for Rutherford 5 critical limb ischemia.  On exam today, [name] was accompanied by his wife's sister (she has been taking care of him since his wife's death).  The hospital discharge, [name] has been doing well.  He has been seeking treatment for foot lymphedema, with 1 more session left.  He denies rest pain, claudication, and states the majority of the wounds on his right foot have healed.  He denies erythema, induration, concern for infection.  The pt is  on a statin for cholesterol management.  The pt is  on a daily aspirin.   Other AC:  Xarelto The pt is is on medication for hypertension.   The pt is  diabetic.  Tobacco hx:  none  Past Medical History:  Diagnosis Date   Abscess    Diabetes mellitus without complication [facility])     Past Surgical History:  Procedure Laterality Date   ABDOMINAL AORTOGRAM W/LOWER EXTREMITY N/A 07/26/2021   Procedure: ABDOMINAL AORTOGRAM W/ Bilateral LOWER EXTREMITY Runoff;  Surgeon: Victorino Sparrow, MD;  Location: [facility] INVASIVE CV LAB;  Service: Cardiovascular;  Laterality: N/A;   PERIPHERAL VASCULAR BALLOON ANGIOPLASTY Right 07/26/2021   Procedure: PERIPHERAL VASCULAR BALLOON ANGIOPLASTY;  Surgeon: Victorino Sparrow, MD;  Location: [facility] INVASIVE CV LAB;  Service: Cardiovascular;  Laterality: Right;  Posterior tibial   PERIPHERAL VASCULAR INTERVENTION Right 07/26/2021   Procedure: PERIPHERAL VASCULAR INTERVENTION;  Surgeon: Victorino Sparrow, MD;  Location: [facility] INVASIVE CV LAB;  Service: Cardiovascular;  Laterality: Right;  Superficial femoral    Social History   Socioeconomic History   Marital status: Widowed    Spouse name: Not on file   Number of children: Not on file   Years of education: Not on file   Highest education  level: Not on file  Occupational History   Not on file  Tobacco Use   Smoking status: Never   Smokeless tobacco: Never  Vaping Use   Vaping Use: Never used  Substance and Sexual Activity   Alcohol use: No   Drug use: Yes    Types: Marijuana   Sexual activity: Not on file  Other Topics Concern   Not on file  Social History Narrative   Not on file   Social Determinants of Health   Financial Resource Strain: Not on file  Food Insecurity: Not on file  Transportation Needs: Not on file  Physical Activity: Not on file  Stress: Not on file  Social Connections: Not on file  Intimate Partner Violence: Not on file    Family History  Problem Relation Age of Onset   Stroke Mother    Diabetes Mother    Heart failure Father     Current Outpatient Medications  Medication Sig Dispense Refill   ALPRAZolam (XANAX) 0.5 MG tablet Take 0.5 mg by mouth 2 (two) times daily.     aspirin 81 MG EC tablet Take 1 tablet (81 mg total) by mouth daily. Swallow whole. 30 tablet 11   atorvastatin (LIPITOR) 80 MG tablet Take 1 tablet (80 mg total) by mouth daily. 30 tablet 0   gabapentin (NEURONTIN) 300 MG capsule Take 300 mg by mouth 3 (three) times daily as needed (pain).     hydrochlorothiazide (HYDRODIURIL) 25 MG tablet Take 1 tablet (25 mg total) by mouth daily. (Patient  not taking: Reported on 07/26/2021) 30 tablet 0   JARDIANCE 25 MG TABS tablet Take 25 mg by mouth daily.     lisinopril (ZESTRIL) 10 MG tablet Take 10 mg by mouth daily.     metFORMIN (GLUCOPHAGE) 500 MG tablet Take 1 tablet (500 mg total) by mouth 2 (two) times daily with a meal. (Patient not taking: Reported on 07/26/2021) 60 tablet 0   Multiple Vitamin (MULTIVITAMIN WITH MINERALS) TABS tablet Take 1 tablet by mouth daily.     naproxen (NAPROSYN) 500 MG tablet Take 1 tablet (500 mg total) by mouth 2 (two) times daily with a meal. (Patient not taking: Reported on 07/26/2021) 20 tablet 0   naproxen sodium (ALEVE) 220 MG tablet Take  220 mg by mouth daily as needed (pain).     omeprazole (PRILOSEC) 20 MG capsule Take 20 mg by mouth daily as needed (indigestion).     No current facility-administered medications for this visit.    No Known Allergies   REVIEW OF SYSTEMS:   [X]  denotes positive finding, [ ]  denotes negative finding Cardiac  Comments:  Chest pain or chest pressure:    Shortness of breath upon exertion:    Short of breath when lying flat:    Irregular heart rhythm:        Vascular    Pain in calf, thigh, or hip brought on by ambulation:    Pain in feet at night that wakes you up from your sleep:     Blood clot in your veins:    Leg swelling:         Pulmonary    Oxygen at home:    Productive cough:     Wheezing:         Neurologic    Sudden weakness in arms or legs:     Sudden numbness in arms or legs:     Sudden onset of difficulty speaking or slurred speech:    Temporary loss of vision in one eye:     Problems with dizziness:         Gastrointestinal    Blood in stool:     Vomited blood:         Genitourinary    Burning when urinating:     Blood in urine:        Psychiatric    Major depression:         Hematologic    Bleeding problems:    Problems with blood clotting too easily:        Skin    Rashes or ulcers:        Constitutional    Fever or chills:      PHYSICAL EXAMINATION:  There were no vitals filed for this visit.  General:  WDWN in NAD; vital signs documented above Gait: Not observed HENT: WNL, normocephalic Pulmonary: normal non-labored breathing , without Rales, rhonchi,  wheezing Cardiac: regular HR Abdomen: soft, NT, no masses Skin: without rashes Vascular Exam/Pulses:  Right Left  Radial 2+ (normal) 2+ (normal)  Ulnar 2+ (normal) 2+ (normal)  Femoral 2+ (normal) 2+ (normal)  Popliteal    DP 2+ (normal) 2+ (normal)  PT 2+ (normal) 1+ (weak)   Extremities: without ischemic changes, without Gangrene , without cellulitis; with right foot healing  wound Musculoskeletal: no muscle wasting or atrophy  Neurologic: A&O X 3;  No focal weakness or paresthesias are detected Psychiatric:  The pt has Normal affect.   Non-Invasive Vascular Imaging:   Right  lower extremity duplex ultrasonography reviewed, no stenosis in the right sided SFA stent Ankle-brachial index independently reviewed demonstrating significant improvement in right lower extremity perfusion.        ASSESSMENT/PLAN: Nehemias Sauceda is a 60 y.o. male presenting in follow-up status post 07/26/2021 Right SFA stenting, popliteal artery, posterior tibial artery, peroneal artery, medial plantar artery balloon angioplasty for Rutherford 5 critical limb ischemia.  Since discharge, Markese has been doing well.  He has become more ambulatory, now walking with a cart to get his groceries, rather than riding in a wheelchair.   Wounds on his right foot have been healing.  He has a palpable pulse in his foot on exam today.  Chayden was asked to continue his current medication regimen which includes aspirin, Xarelto, statin He is a non-smoker He was asked to continue to work on his A1c I will see the patient back in the office in 6 months with repeat ABI and lower extremity arterial duplex   Victorino Sparrow, MD [facility] [phone]

## 2021-09-01 ENCOUNTER — Ambulatory Visit (INDEPENDENT_AMBULATORY_CARE_PROVIDER_SITE_OTHER): Payer: Medicare Other | Admitting: Vascular Surgery

## 2021-09-01 ENCOUNTER — Ambulatory Visit (INDEPENDENT_AMBULATORY_CARE_PROVIDER_SITE_OTHER)
Admission: RE | Admit: 2021-09-01 | Discharge: 2021-09-01 | Disposition: A | Discharge status: Home / Self Care | Payer: Medicare Other | Source: Ambulatory Visit | Attending: Vascular Surgery | Admitting: Vascular Surgery

## 2021-09-01 ENCOUNTER — Encounter: Payer: Self-pay | Admitting: Vascular Surgery

## 2021-09-01 ENCOUNTER — Other Ambulatory Visit: Payer: Self-pay

## 2021-09-01 ENCOUNTER — Ambulatory Visit (HOSPITAL_COMMUNITY)
Admission: RE | Admit: 2021-09-01 | Discharge: 2021-09-01 | Disposition: A | Discharge status: Home / Self Care | Payer: Medicare Other | Source: Ambulatory Visit | Attending: Vascular Surgery | Admitting: Vascular Surgery

## 2021-09-01 VITALS — BP 163/98 | HR 65 | Temp 98.2°F | Resp 20 | Ht 69.0 in | Wt 179.6 lb

## 2021-09-01 DIAGNOSIS — I739 Peripheral vascular disease, unspecified: Secondary | ICD-10-CM | POA: Diagnosis present

## 2021-09-04 ENCOUNTER — Other Ambulatory Visit: Payer: Self-pay

## 2021-09-04 DIAGNOSIS — I739 Peripheral vascular disease, unspecified: Secondary | ICD-10-CM

## 2022-02-12 ENCOUNTER — Other Ambulatory Visit: Payer: Self-pay | Admitting: *Deleted

## 2022-02-12 DIAGNOSIS — I739 Peripheral vascular disease, unspecified: Secondary | ICD-10-CM

## 2022-02-16 ENCOUNTER — Ambulatory Visit (HOSPITAL_COMMUNITY)
Admission: RE | Admit: 2022-02-16 | Discharge: 2022-02-16 | Disposition: A | Discharge status: Home / Self Care | Payer: Medicare Other | Source: Ambulatory Visit | Attending: Vascular Surgery | Admitting: Vascular Surgery

## 2022-02-16 ENCOUNTER — Ambulatory Visit (INDEPENDENT_AMBULATORY_CARE_PROVIDER_SITE_OTHER)
Admission: RE | Admit: 2022-02-16 | Discharge: 2022-02-16 | Disposition: A | Discharge status: Home / Self Care | Payer: Medicare Other | Source: Ambulatory Visit | Attending: Vascular Surgery | Admitting: Vascular Surgery

## 2022-02-16 ENCOUNTER — Encounter: Payer: Self-pay | Admitting: Vascular Surgery

## 2022-02-16 ENCOUNTER — Ambulatory Visit: Payer: Medicare Other | Admitting: Vascular Surgery

## 2022-02-16 ENCOUNTER — Other Ambulatory Visit: Payer: Self-pay

## 2022-02-16 VITALS — BP 185/103 | HR 76 | Temp 98.1°F | Resp 18 | Ht 69.0 in | Wt 165.0 lb

## 2022-02-16 DIAGNOSIS — I70221 Atherosclerosis of native arteries of extremities with rest pain, right leg: Secondary | ICD-10-CM | POA: Diagnosis not present

## 2022-02-16 DIAGNOSIS — I739 Peripheral vascular disease, unspecified: Secondary | ICD-10-CM

## 2022-02-17 NOTE — Progress Notes (Signed)
?Office Note  ? ? ?HPI: Daniel Byrd is a 60 y.o. ([birth year]) male presenting in follow-up status post 07/26/2021 Right SFA stenting, popliteal artery, posterior tibial artery, peroneal artery, medial plantar artery balloon angioplasty for Rutherford 5 critical limb ischemia. ? ?On exam today, Daniel Byrd was accompanied by his wife's sister (she has been taking care of him since his wife's death).  Since his last visit, Daniel Byrd has been doing fairly well.  The wounds on his left foot has nearly healed, he has 2 areas of superficial ulceration which appears to be from dry cracked feet on his heel.  He has been moderately ambulatory, walking his dog, but continues to have paresthesias in his right great toe.   He denies erythema, induration, concern for infection. ? ?The pt is  on a statin for cholesterol management.  ?The pt is  on a daily aspirin.   Other AC:  Xarelto ?The pt is is on medication for hypertension.   ?The pt is  diabetic.  ?Tobacco hx:  none ? ?Past Medical History:  ?Diagnosis Date  ? Abscess   ? Diabetes mellitus without complication ([place])   ? ? ?Past Surgical History:  ?Procedure Laterality Date  ? ABDOMINAL AORTOGRAM W/LOWER EXTREMITY N/A 07/26/2021  ? Procedure: ABDOMINAL AORTOGRAM W/ Bilateral LOWER EXTREMITY Runoff;  Surgeon: Broadus John, MD;  Location: [place] CV LAB;  Service: Cardiovascular;  Laterality: N/A;  ? PERIPHERAL VASCULAR BALLOON ANGIOPLASTY Right 07/26/2021  ? Procedure: PERIPHERAL VASCULAR BALLOON ANGIOPLASTY;  Surgeon: Broadus John, MD;  Location: [place] CV LAB;  Service: Cardiovascular;  Laterality: Right;  Posterior tibial  ? PERIPHERAL VASCULAR INTERVENTION Right 07/26/2021  ? Procedure: PERIPHERAL VASCULAR INTERVENTION;  Surgeon: Broadus John, MD;  Location: [place] CV LAB;  Service: Cardiovascular;  Laterality: Right;  Superficial femoral  ? ? ?Social History  ? ?Socioeconomic History  ? Marital status: Widowed  ?  Spouse name: Not on file  ? Number of  children: Not on file  ? Years of education: Not on file  ? Highest education level: Not on file  ?Occupational History  ? Not on file  ?Tobacco Use  ? Smoking status: Never  ? Smokeless tobacco: Never  ?Vaping Use  ? Vaping Use: Never used  ?Substance and Sexual Activity  ? Alcohol use: No  ? Drug use: Yes  ?  Types: Marijuana  ? Sexual activity: Not on file  ?Other Topics Concern  ? Not on file  ?Social History Narrative  ? Not on file  ? ?Social Determinants of Health  ? ?Financial Resource Strain: Not on file  ?Food Insecurity: Not on file  ?Transportation Needs: Not on file  ?Physical Activity: Not on file  ?Stress: Not on file  ?Social Connections: Not on file  ?Intimate Partner Violence: Not on file  ? ? ?Family History  ?Problem Relation Age of Onset  ? Stroke Mother   ? Diabetes Mother   ? Heart failure Father   ? ? ?Current Outpatient Medications  ?Medication Sig Dispense Refill  ? ALPRAZolam (XANAX) 0.5 MG tablet Take 0.5 mg by mouth 2 (two) times daily.    ? aspirin 81 MG EC tablet Take 1 tablet (81 mg total) by mouth daily. Swallow whole. 30 tablet 11  ? JARDIANCE 25 MG TABS tablet Take 25 mg by mouth daily.    ? lisinopril (ZESTRIL) 10 MG tablet Take 10 mg by mouth daily.    ? naproxen sodium (ALEVE) 220 MG tablet Take 220  mg by mouth daily as needed (pain).    ? omeprazole (PRILOSEC) 20 MG capsule Take 20 mg by mouth daily as needed (indigestion).    ? XARELTO 20 MG TABS tablet Take by mouth.    ? atorvastatin (LIPITOR) 80 MG tablet Take 1 tablet (80 mg total) by mouth daily. 30 tablet 0  ? gabapentin (NEURONTIN) 300 MG capsule Take 300 mg by mouth 3 (three) times daily as needed (pain). (Patient not taking: Reported on 09/01/2021)    ? hydrochlorothiazide (HYDRODIURIL) 25 MG tablet Take 1 tablet (25 mg total) by mouth daily. (Patient not taking: No sig reported) 30 tablet 0  ? ?No current facility-administered medications for this visit.  ? ? ?No Known Allergies ? ? ?REVIEW OF SYSTEMS:  ? ?[X]  denotes  positive finding, [ ]  denotes negative finding ?Cardiac  Comments:  ?Chest pain or chest pressure:    ?Shortness of breath upon exertion:    ?Short of breath when lying flat:    ?Irregular heart rhythm:    ?    ?Vascular    ?Pain in calf, thigh, or hip brought on by ambulation:    ?Pain in feet at night that wakes you up from your sleep:     ?Blood clot in your veins:    ?Leg swelling:     ?    ?Pulmonary    ?Oxygen at home:    ?Productive cough:     ?Wheezing:     ?    ?Neurologic    ?Sudden weakness in arms or legs:     ?Sudden numbness in arms or legs:     ?Sudden onset of difficulty speaking or slurred speech:    ?Temporary loss of vision in one eye:     ?Problems with dizziness:     ?    ?Gastrointestinal    ?Blood in stool:     ?Vomited blood:     ?    ?Genitourinary    ?Burning when urinating:     ?Blood in urine:    ?    ?Psychiatric    ?Major depression:     ?    ?Hematologic    ?Bleeding problems:    ?Problems with blood clotting too easily:    ?    ?Skin    ?Rashes or ulcers:    ?    ?Constitutional    ?Fever or chills:    ? ? ?PHYSICAL EXAMINATION: ? ?Vitals:  ? 02/16/22 1527 02/16/22 1532  ?BP: (!) 199/96 (!) 185/103  ?Pulse: 76 76  ?Resp: 18   ?Temp: 98.1 ?F (36.7 ?C)   ?TempSrc: Temporal   ?SpO2: 96%   ?Weight: 165 lb (74.8 kg)   ?Height: 5\' 9"  (1.753 m)   ? ? ?General:  WDWN in NAD; vital signs documented above ?Gait: Not observed ?HENT: WNL, normocephalic ?Pulmonary: normal non-labored breathing , without Rales, rhonchi,  wheezing ?Cardiac: regular HR ?Abdomen: soft, NT, no masses ?Skin: without rashes ?Vascular Exam/Pulses: ? Right Left  ?Radial 2+ (normal) 2+ (normal)  ?Ulnar 2+ (normal) 2+ (normal)  ?Femoral 2+ (normal) 2+ (normal)  ?Popliteal    ?DP absent 2+ (normal)  ?PT absent 1+ (weak)  ? ?Extremities: without ischemic changes, without Gangrene , without cellulitis; with right heal cracked from dry skin ?Musculoskeletal: no muscle wasting or atrophy  ?Neurologic: A&O X 3;  No focal weakness  or paresthesias are detected ?Psychiatric:  The pt has Normal affect. ? ? ?Non-Invasive Vascular Imaging:   ?Right  lower extremity duplex ultrasonography reviewed, no stenosis in the right sided SFA stent ?Appears to be stenosis versus occlusion distally in the popliteal artery. ?Ankle-brachial index independently reviewed demonstrating significant decrease in toe pressure. ? ? ? ? ?ASSESSMENT/PLAN: Daniel Byrd is a 61 y.o. male presenting in follow-up status post 07/26/2021 Right SFA stenting, popliteal artery, posterior tibial artery, peroneal artery, medial plantar artery balloon angioplasty for Rutherford 5 critical limb ischemia.  Since his last visit, [place] wounds have nearly healed, he continues to have residual paresthesias in the right great toe.  He has mild claudication, however admits he needs to walk more. ? ?ABI and duplex ultrasound were reviewed demonstrating patent SFA stent with concern for popliteal artery stenosis versus occlusion.  There is a significant decrease in toe pressure with monophasic waveforms in the tibial arteries.  I am concerned that such a lesion could cause stent thrombosis.  He would benefit from right lower extremity angiography in an effort to assess, and improve lower extremity perfusion for primary assisted patency of his right SFA stent ? ?After discussing the risks and benefits, Clair Gulling elected to proceed. ? ? ?Broadus John, MD ?[facility] ?[phone]  ?

## 2022-02-21 ENCOUNTER — Other Ambulatory Visit: Payer: Self-pay

## 2022-02-21 ENCOUNTER — Ambulatory Visit (HOSPITAL_COMMUNITY)
Admission: RE | Admit: 2022-02-21 | Discharge: 2022-02-21 | Disposition: A | Discharge status: Home / Self Care | Payer: Medicare Other | Attending: Vascular Surgery | Admitting: Vascular Surgery

## 2022-02-21 ENCOUNTER — Ambulatory Visit (HOSPITAL_COMMUNITY)
Admission: RE | Disposition: A | Discharge status: Home / Self Care | Payer: Self-pay | Source: Home / Self Care | Attending: Vascular Surgery

## 2022-02-21 DIAGNOSIS — I10 Essential (primary) hypertension: Secondary | ICD-10-CM | POA: Diagnosis not present

## 2022-02-21 DIAGNOSIS — Z7901 Long term (current) use of anticoagulants: Secondary | ICD-10-CM | POA: Diagnosis not present

## 2022-02-21 DIAGNOSIS — E1151 Type 2 diabetes mellitus with diabetic peripheral angiopathy without gangrene: Secondary | ICD-10-CM | POA: Diagnosis present

## 2022-02-21 DIAGNOSIS — Y832 Surgical operation with anastomosis, bypass or graft as the cause of abnormal reaction of the patient, or of later complication, without mention of misadventure at the time of the procedure: Secondary | ICD-10-CM | POA: Insufficient documentation

## 2022-02-21 DIAGNOSIS — Z7982 Long term (current) use of aspirin: Secondary | ICD-10-CM | POA: Diagnosis not present

## 2022-02-21 DIAGNOSIS — T82856A Stenosis of peripheral vascular stent, initial encounter: Secondary | ICD-10-CM | POA: Insufficient documentation

## 2022-02-21 DIAGNOSIS — I70211 Atherosclerosis of native arteries of extremities with intermittent claudication, right leg: Secondary | ICD-10-CM | POA: Diagnosis not present

## 2022-02-21 DIAGNOSIS — I70234 Atherosclerosis of native arteries of right leg with ulceration of heel and midfoot: Secondary | ICD-10-CM | POA: Diagnosis not present

## 2022-02-21 HISTORY — PX: PERIPHERAL VASCULAR ATHERECTOMY: CATH118256

## 2022-02-21 HISTORY — PX: PERIPHERAL VASCULAR BALLOON ANGIOPLASTY: CATH118281

## 2022-02-21 HISTORY — PX: ABDOMINAL AORTOGRAM W/LOWER EXTREMITY: CATH118223

## 2022-02-21 LAB — POCT I-STAT, CHEM 8
BUN: 26 mg/dL — ABNORMAL HIGH (ref 8–23)
Calcium, Ion: 1.33 mmol/L (ref 1.15–1.40)
Chloride: 102 mmol/L (ref 98–111)
Creatinine, Ser: 0.9 mg/dL (ref 0.61–1.24)
Glucose, Bld: 141 mg/dL — ABNORMAL HIGH (ref 70–99)
HCT: 46 % (ref 39.0–52.0)
Hemoglobin: 15.6 g/dL (ref 13.0–17.0)
Potassium: 4.4 mmol/L (ref 3.5–5.1)
Sodium: 139 mmol/L (ref 135–145)
TCO2: 29 mmol/L (ref 22–32)

## 2022-02-21 LAB — GLUCOSE, CAPILLARY: Glucose-Capillary: 117 mg/dL — ABNORMAL HIGH (ref 70–99)

## 2022-02-21 LAB — POCT ACTIVATED CLOTTING TIME: Activated Clotting Time: 257 seconds

## 2022-02-21 SURGERY — ABDOMINAL AORTOGRAM W/LOWER EXTREMITY
Anesthesia: LOCAL

## 2022-02-21 MED ORDER — XARELTO 20 MG PO TABS: 20.0000 mg | ORAL_TABLET | Freq: Every day | ORAL | Status: AC

## 2022-02-21 MED ORDER — FENTANYL CITRATE (PF) 100 MCG/2ML IJ SOLN
INTRAMUSCULAR | Status: AC
  Filled 2022-02-21: qty 2

## 2022-02-21 MED ORDER — ASPIRIN EC 81 MG PO TBEC: 81.0000 mg | DELAYED_RELEASE_TABLET | Freq: Every day | ORAL | Status: DC

## 2022-02-21 MED ORDER — LIDOCAINE HCL (PF) 1 % IJ SOLN
INTRAMUSCULAR | Status: AC
  Filled 2022-02-21: qty 30

## 2022-02-21 MED ORDER — HEPARIN (PORCINE) IN NACL 1000-0.9 UT/500ML-% IV SOLN
INTRAVENOUS | Status: AC
  Filled 2022-02-21: qty 1000

## 2022-02-21 MED ORDER — LIDOCAINE HCL (PF) 1 % IJ SOLN
INTRAMUSCULAR | Status: DC | PRN
Start: 2022-02-21 — End: 2022-02-21
  Administered 2022-02-21: 12 mL

## 2022-02-21 MED ORDER — IODIXANOL 320 MG/ML IV SOLN
INTRAVENOUS | Status: DC | PRN
  Administered 2022-02-21: 155 mL

## 2022-02-21 MED ORDER — SODIUM CHLORIDE 0.9 % IV SOLN: INTRAVENOUS | Status: DC

## 2022-02-21 MED ORDER — MIDAZOLAM HCL 2 MG/2ML IJ SOLN
INTRAMUSCULAR | Status: AC
Start: 2022-02-21 — End: ?
  Filled 2022-02-21: qty 2

## 2022-02-21 MED ORDER — ONDANSETRON HCL 4 MG/2ML IJ SOLN: 4.0000 mg | Freq: Four times a day (QID) | INTRAMUSCULAR | Status: DC | PRN

## 2022-02-21 MED ORDER — ACETAMINOPHEN 325 MG PO TABS: 650.0000 mg | ORAL_TABLET | ORAL | Status: DC | PRN

## 2022-02-21 MED ORDER — SODIUM CHLORIDE 0.9 % IV SOLN: 250.0000 mL | INTRAVENOUS | Status: DC | PRN

## 2022-02-21 MED ORDER — SODIUM CHLORIDE 0.9% FLUSH: 3.0000 mL | INTRAVENOUS | Status: DC | PRN

## 2022-02-21 MED ORDER — HEPARIN SODIUM (PORCINE) 1000 UNIT/ML IJ SOLN
INTRAMUSCULAR | Status: AC
  Filled 2022-02-21: qty 20

## 2022-02-21 MED ORDER — SODIUM CHLORIDE 0.9% FLUSH: 3.0000 mL | Freq: Two times a day (BID) | INTRAVENOUS | Status: DC

## 2022-02-21 MED ORDER — NITROGLYCERIN 1 MG/10 ML FOR IR/CATH LAB
INTRA_ARTERIAL | Status: AC
  Filled 2022-02-21: qty 10

## 2022-02-21 MED ORDER — HYDRALAZINE HCL 20 MG/ML IJ SOLN: 5.0000 mg | INTRAMUSCULAR | Status: DC | PRN

## 2022-02-21 MED ORDER — HEPARIN (PORCINE) IN NACL 1000-0.9 UT/500ML-% IV SOLN
INTRAVENOUS | Status: DC | PRN
  Administered 2022-02-21 (×2): 500 mL

## 2022-02-21 MED ORDER — HYDRALAZINE HCL 20 MG/ML IJ SOLN
INTRAMUSCULAR | Status: AC
  Filled 2022-02-21: qty 1

## 2022-02-21 MED ORDER — SODIUM CHLORIDE 0.9 % WEIGHT BASED INFUSION: 1.0000 mL/kg/h | INTRAVENOUS | Status: DC

## 2022-02-21 MED ORDER — LABETALOL HCL 5 MG/ML IV SOLN: 10.0000 mg | INTRAVENOUS | Status: DC | PRN

## 2022-02-21 MED ORDER — HEPARIN SODIUM (PORCINE) 1000 UNIT/ML IJ SOLN
INTRAMUSCULAR | Status: DC | PRN
  Administered 2022-02-21: 5000 [IU] via INTRAVENOUS
  Administered 2022-02-21: 7000 [IU] via INTRAVENOUS
  Administered 2022-02-21: 2000 [IU] via INTRAVENOUS

## 2022-02-21 MED ORDER — NITROGLYCERIN 1 MG/10 ML FOR IR/CATH LAB
INTRA_ARTERIAL | Status: DC | PRN
  Administered 2022-02-21: 200 ug

## 2022-02-21 MED ORDER — HEPARIN SODIUM (PORCINE) 1000 UNIT/ML IJ SOLN
INTRAMUSCULAR | Status: AC
  Filled 2022-02-21: qty 10

## 2022-02-21 MED ORDER — MIDAZOLAM HCL 2 MG/2ML IJ SOLN
INTRAMUSCULAR | Status: DC | PRN
  Administered 2022-02-21 (×2): 1 mg via INTRAVENOUS

## 2022-02-21 MED ORDER — FENTANYL CITRATE (PF) 100 MCG/2ML IJ SOLN
INTRAMUSCULAR | Status: DC | PRN
  Administered 2022-02-21 (×2): 50 ug via INTRAVENOUS

## 2022-02-21 SURGICAL SUPPLY — 31 items
BAG SNAP BAND KOVER 36X36 (MISCELLANEOUS) ×1 IMPLANT
BALLN COYOTE OTW 2.5X150X150 (BALLOONS) ×3
BALLN COYOTE OTW 4X60X150 (BALLOONS) ×3
BALLN JADE .014 2.5  X 240 (BALLOONS) ×1
BALLN JADE .014 2.5 X 240 (BALLOONS) ×2
BALLOON COYOTE OTW 2.5X150X150 (BALLOONS) IMPLANT
BALLOON COYOTE OTW 4X60X150 (BALLOONS) IMPLANT
BALLOON JADE .014 2.5 X 240 (BALLOONS) IMPLANT
CATH AURYON ATHERECTOMY 0.9 (CATHETERS) ×1 IMPLANT
CATH CXI 2.3F 135 ST (CATHETERS) ×1 IMPLANT
CATH OMNI FLUSH 5F 65CM (CATHETERS) ×1 IMPLANT
CATH QUICKCROSS ANG SELECT (CATHETERS) ×1 IMPLANT
DCB RANGER 5.0X60 135 (BALLOONS) IMPLANT
DEVICE CLOSURE MYNXGRIP 6/7F (Vascular Products) ×1 IMPLANT
GLIDEWIRE ADV .035X260CM (WIRE) ×1 IMPLANT
KIT ENCORE 26 ADVANTAGE (KITS) ×1 IMPLANT
KIT MICROPUNCTURE NIT STIFF (SHEATH) ×1 IMPLANT
KIT PV (KITS) ×3 IMPLANT
RANGER DCB 5.0X60 135 (BALLOONS) ×3
SHEATH PINNACLE 5F 10CM (SHEATH) ×1 IMPLANT
SHEATH PINNACLE 6F 10CM (SHEATH) ×1 IMPLANT
SHEATH PINNACLE R/O II 5F 6CM (SHEATH) ×1 IMPLANT
SHEATH PINNACLE ST 6F 65CM (SHEATH) ×1 IMPLANT
SHEATH PROBE COVER 6X72 (BAG) ×1 IMPLANT
SYR MEDRAD MARK V 150ML (SYRINGE) ×1 IMPLANT
TRANSDUCER W/STOPCOCK (MISCELLANEOUS) ×3 IMPLANT
TRAY PV CATH (CUSTOM PROCEDURE TRAY) ×3 IMPLANT
WIRE BENTSON .035X145CM (WIRE) ×1 IMPLANT
WIRE G V18X300CM (WIRE) ×1 IMPLANT
WIRE SHEPHERD 12G .018 (WIRE) ×1 IMPLANT
WIRE SPARTACORE .014X300CM (WIRE) ×1 IMPLANT

## 2022-02-21 NOTE — Op Note (Signed)
? ? ?Patient name: Daniel Byrd MRN: [id number] DOB: [birth year] Sex: male ? ?02/21/2022 ?Pre-operative Diagnosis: Critical limb ischemia ?Post-operative diagnosis:  Same ?Surgeon:  Broadus John, MD ?Procedure Performed: ?Ultrasound-guided micropuncture access of the left common femoral artery ?Aortogram ?Second-order cannulation, right lower extremity angiogram ?Drug-coated balloon angioplasty of the previously stented superficial femoral artery for greater than 50% stenosis ?Balloon angioplasty of the P3 segment of the popliteal artery for multiple, flow-limiting lesions. ?Balloon angioplasty of the anterior tibial artery,  ?laser atherectomy of the anterior tibial artery ?Attempted cannulation of the posterior tibial artery, no outflow to the foot ?Attempted cannulation of the peroneal artery ?Device assisted closure-Mynx ?Moderate sedation time 113 minutes ?Contrast volume 155 ml ? ?Indications: Patient is a 61 year old male with history of acute on chronic limb ischemia.  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  He has known right heel wounds, these have been slowly healing.  After discussing the risk and benefits of right lower extremity angiogram with possible intervention in an effort to improve distal perfusion for assisted primary patency of his superficial femoral artery stents, as well as to improve wound healing of his heel, Daniel Byrd elected to proceed. ? ?Findings:  ?Aortogram: Bilateral renal arteries patent, no flow-limiting stenosis in the aortoiliac system ?On the right: Normal common femoral artery, normal profunda, superficial femoral artery with previously placed stents with a small area of in-stent restenosis measuring roughly 60%.  Distally, the popliteal artery occludes at the P3 segment.  Prior to this occlusion, there are multiple geniculate collaterals running down to fill  the calf.  There are no named vessels in the calf or foot. ?  ?Procedure:  The patient was identified in the holding area and taken to room 8.  The patient was then placed supine on the table and prepped and draped in the usual sterile fashion.  A time out was called.  Ultrasound was used to evaluate the left common femoral artery.  It was patent .  A digital ultrasound image was acquired.  A micropuncture needle was used to access the left common femoral artery under ultrasound guidance.  An 018 wire was advanced without resistance and a micropuncture sheath was placed.  The 018 wire was removed and a benson wire was placed.  The micropuncture sheath was exchanged for a 5 french sheath.  An omniflush catheter was advanced over the wire to the level of L-1.  An abdominal angiogram was obtained.  Next, using the omniflush catheter and a benson wire, the aortic bifurcation was crossed and the catheter was placed into theright external iliac artery and right runoff was obtained.   ? ?I elected to attempt cannulization of the below-knee popliteal artery as well as tibial vessels. ?A 6 x 65 cm sheath was brought to the field and parked in the superficial femoral artery.  Next, the patient was heparinized.  A series of wires and catheters were used to traverse the below-knee popliteal artery.  I was able to get a wire from the P3 segment of the popliteal artery into the anterior tibial artery.  This was run to the dorsalis pedis artery a catheter was used to ensure that was true lumen.  Angiography demonstrated true lumen, however no runoff in the foot.  I was able to pass the wire even further, and elected to attempt angioplasty.  A 2.5 x 240 mm balloon was brought onto the field and inflated from the  below-knee popliteal artery occlusion through the anterior tibial artery, and into the dorsalis pedis artery.  Next, I used our 0.9 laser atherectomy device which was run from the below-knee popliteal artery into the  dorsalis pedis artery, followed by angioplasty again.  Follow-up imaging demonstrated improvement with large diameter anterior tibial artery.  Unfortunately, outflow remained limited.  I then turned my attention to the posterior tibial artery.  In similar fashion, I was able to traverse the posterior tibial artery to the foot, follow-up angiogram demonstrated I was true lumen distally, however there was no named outflow vessels.  Being that this looked even worse than the dorsalis pedis artery, which gave off some collaterals, I elected to remove the wire without angioplasty.  I was able to engage the peroneal artery, however could not traverse it with a wire.  A 4 x 80 drug-coated balloon was brought onto the field and positioned in the below-knee popliteal artery.  Follow-up angiogram demonstrated improvement.  Next, I moved proximally to the in-stent restenosis appreciated roughly 2 cm from the proximal aspect of the stent.  This was angioplastied using a 5 x 40 mm drug-coated balloon.  Follow-up angiography demonstrated improvement. ?Wires and sheaths were removed and the patient was closed using a minx device. ? ? ?Impression: Unfortunately, Daniel Byrd does not have any outflow of the foot.  I attempted anterior tibial artery and posterior tibial artery recanalization.  I was able to open the anterior tibial artery with balloon angioplasty and laser atherectomy, however outflow was poor.  The previously occluded below-knee popliteal artery was opened, however with poor outflow, this will also likely occluded.  There is a chance to meet these superficial femoral artery stents remain patent due to the geniculate collaterals immediately proximal to the popliteal artery occlusion.  Daniel Byrd is aware he has been maximally revascularized at this point with no endovascular or open options.  Should he have wounds that worsen, he will require at minimum in a below-knee amputation. ? ? ?J. Melene Muller, MD ?[facility] ?Office: [phone] ? ? ? ?

## 2022-02-21 NOTE — H&P (Signed)
?Office Note  ? ?Patient seen and examined in preop holding.  No complaints. ?No changes to medication history or physical exam since last seen in clinic. ?After discussing the risks and benefits of right leg angiogram, Daniel Byrd elected to proceed.  ? ?Daniel John MD ? ? ?HPI: Daniel Byrd is a 61 y.o. ([birth year]) male presenting in follow-up status post 07/26/2021 Right SFA stenting, popliteal artery, posterior tibial artery, peroneal artery, medial plantar artery balloon angioplasty for Rutherford 5 critical limb ischemia. ? ?On exam today, [name] was accompanied by his wife's sister (she has been taking care of him since his wife's death).  Since his last visit, Daniel Byrd has been doing fairly well.  The wounds on his left foot has nearly healed, he has 2 areas of superficial ulceration which appears to be from dry cracked feet on his heel.  He has been moderately ambulatory, walking his dog, but continues to have paresthesias in his right great toe.   He denies erythema, induration, concern for infection. ? ?The pt is  on a statin for cholesterol management.  ?The pt is  on a daily aspirin.   Other AC:  Xarelto ?The pt is is on medication for hypertension.   ?The pt is  diabetic.  ?Tobacco hx:  none ? ?Past Medical History:  ?Diagnosis Date  ? Abscess   ? Diabetes mellitus without complication ([place])   ? ? ?Past Surgical History:  ?Procedure Laterality Date  ? ABDOMINAL AORTOGRAM W/LOWER EXTREMITY N/A 07/26/2021  ? Procedure: ABDOMINAL AORTOGRAM W/ Bilateral LOWER EXTREMITY Runoff;  Surgeon: Daniel John, MD;  Location: [facility];  Service: Cardiovascular;  Laterality: N/A;  ? PERIPHERAL VASCULAR BALLOON ANGIOPLASTY Right 07/26/2021  ? Procedure: PERIPHERAL VASCULAR BALLOON ANGIOPLASTY;  Surgeon: Daniel John, MD;  Location: [facility];  Service: Cardiovascular;  Laterality: Right;  Posterior tibial  ? PERIPHERAL VASCULAR INTERVENTION Right 07/26/2021  ? Procedure: PERIPHERAL VASCULAR  INTERVENTION;  Surgeon: Daniel John, MD;  Location: [place];  Service: Cardiovascular;  Laterality: Right;  Superficial femoral  ? ? ?Social History  ? ?Socioeconomic History  ? Marital status: Widowed  ?  Spouse name: Not on file  ? Number of children: Not on file  ? Years of education: Not on file  ? Highest education level: Not on file  ?Occupational History  ? Not on file  ?Tobacco Use  ? Smoking status: Never  ? Smokeless tobacco: Never  ?Vaping Use  ? Vaping Use: Never used  ?Substance and Sexual Activity  ? Alcohol use: No  ? Drug use: Yes  ?  Types: Marijuana  ? Sexual activity: Not on file  ?Other Topics Concern  ? Not on file  ?Social History Narrative  ? Not on file  ? ?Social Determinants of Health  ? ?Financial Resource Strain: Not on file  ?Food Insecurity: Not on file  ?Transportation Needs: Not on file  ?Physical Activity: Not on file  ?Stress: Not on file  ?Social Connections: Not on file  ?Intimate Partner Violence: Not on file  ? ? ?Family History  ?Problem Relation Age of Onset  ? Stroke Mother   ? Diabetes Mother   ? Heart failure Father   ? ? ?Current Facility-Administered Medications  ?Medication Dose Route Frequency Provider Last Rate Last Admin  ? 0.9 %  sodium chloride infusion   Intravenous Continuous Daniel John, MD 100 mL/hr at 02/21/22 0911 New Bag at 02/21/22 0911  ? ? ?  No Known Allergies ? ? ?REVIEW OF SYSTEMS:  ? ?[X]  denotes positive finding, [ ]  denotes negative finding ?Cardiac  Comments:  ?Chest pain or chest pressure:    ?Shortness of breath upon exertion:    ?Short of breath when lying flat:    ?Irregular heart rhythm:    ?    ?Vascular    ?Pain in calf, thigh, or hip brought on by ambulation:    ?Pain in feet at night that wakes you up from your sleep:     ?Blood clot in your veins:    ?Leg swelling:     ?    ?Pulmonary    ?Oxygen at home:    ?Productive cough:     ?Wheezing:     ?    ?Neurologic    ?Sudden weakness in arms or legs:     ?Sudden numbness in  arms or legs:     ?Sudden onset of difficulty speaking or slurred speech:    ?Temporary loss of vision in one eye:     ?Problems with dizziness:     ?    ?Gastrointestinal    ?Blood in stool:     ?Vomited blood:     ?    ?Genitourinary    ?Burning when urinating:     ?Blood in urine:    ?    ?Psychiatric    ?Major depression:     ?    ?Hematologic    ?Bleeding problems:    ?Problems with blood clotting too easily:    ?    ?Skin    ?Rashes or ulcers:    ?    ?Constitutional    ?Fever or chills:    ? ? ?PHYSICAL EXAMINATION: ? ?Vitals:  ? 02/21/22 0829  ?BP: (!) 142/79  ?Pulse: 61  ?Resp: 20  ?Temp: 97.6 ?F (36.4 ?C)  ?TempSrc: Oral  ?SpO2: 100%  ?Weight: 73.5 kg  ?Height: 5\' 9"  (1.753 m)  ? ? ?General:  WDWN in NAD; vital signs documented above ?Gait: Not observed ?HENT: WNL, normocephalic ?Pulmonary: normal non-labored breathing , without Rales, rhonchi,  wheezing ?Cardiac: regular HR ?Abdomen: soft, NT, no masses ?Skin: without rashes ?Vascular Exam/Pulses: ? Right Left  ?Radial 2+ (normal) 2+ (normal)  ?Ulnar 2+ (normal) 2+ (normal)  ?Femoral 2+ (normal) 2+ (normal)  ?Popliteal    ?DP absent 2+ (normal)  ?PT absent 1+ (weak)  ? ?Extremities: without ischemic changes, without Gangrene , without cellulitis; with right heal cracked from dry skin ?Musculoskeletal: no muscle wasting or atrophy  ?Neurologic: A&O X 3;  No focal weakness or paresthesias are detected ?Psychiatric:  The pt has Normal affect. ? ? ?Non-Invasive Vascular Imaging:   ?Right lower extremity duplex ultrasonography reviewed, no stenosis in the right sided SFA stent ?Appears to be stenosis versus occlusion distally in the popliteal artery. ?Ankle-brachial index independently reviewed demonstrating significant decrease in toe pressure. ? ? ? ? ?ASSESSMENT/PLAN: Daniel Byrd is a 61 y.o. male presenting in follow-up status post 07/26/2021 Right SFA stenting, popliteal artery, posterior tibial artery, peroneal artery, medial plantar artery balloon  angioplasty for Rutherford 5 critical limb ischemia.  Since his last visit, Daniel Byrd wounds have nearly healed, he continues to have residual paresthesias in the right great toe.  He has mild claudication, however admits he needs to walk more. ? ?ABI and duplex ultrasound were reviewed demonstrating patent SFA stent with concern for popliteal artery stenosis versus occlusion.  There is a significant decrease in toe pressure with  monophasic waveforms in the tibial arteries.  I am concerned that such a lesion could cause stent thrombosis.  He would benefit from right lower extremity angiography in an effort to assess, and improve lower extremity perfusion for primary assisted patency of his right SFA stent ? ?After discussing the risks and benefits, Clair Gulling elected to proceed. ? ? ?Daniel John, MD ?[facility] ?[phone]  ?

## 2022-02-22 ENCOUNTER — Encounter (HOSPITAL_COMMUNITY): Payer: Self-pay | Admitting: Vascular Surgery

## 2022-02-22 MED FILL — Hydralazine HCl Inj 20 MG/ML: INTRAMUSCULAR | Qty: 1 | Status: AC

## 2022-03-21 ENCOUNTER — Other Ambulatory Visit: Payer: Self-pay | Admitting: *Deleted

## 2022-03-21 DIAGNOSIS — I739 Peripheral vascular disease, unspecified: Secondary | ICD-10-CM

## 2022-03-21 DIAGNOSIS — I70221 Atherosclerosis of native arteries of extremities with rest pain, right leg: Secondary | ICD-10-CM

## 2022-03-29 NOTE — Progress Notes (Unsigned)
Office Note    HPI: Daniel Byrd is a 61 y.o. ([birth year]) male presenting in follow-up status post 07/26/2021 Right SFA stenting, popliteal artery, posterior tibial artery, peroneal artery, medial plantar artery balloon angioplasty for Rutherford 5 critical limb ischemia.Mostly recently Daniel Byrd underwent repeat angiogram for RLE rutherfird 4 CLI demonstrating no outflow to the foot.   On exam today, Daniel Byrd was accompanied by his wife's sister (she has been taking care of him since his wife's death).  Since his last visit, Daniel Byrd has been doing fairly well.  The wounds on his left foot has nearly healed, he has 2 areas of superficial ulceration which appears to be from dry cracked feet on his heel.  He has been moderately ambulatory, walking his dog, but continues to have paresthesias in his right great toe.   He denies erythema, induration, concern for infection.  The pt is  on a statin for cholesterol management.  The pt is  on a daily aspirin.   Other AC:  Xarelto The pt is is on medication for hypertension.   The pt is  diabetic.  Tobacco hx:  none  Past Medical History:  Diagnosis Date   Abscess    Diabetes mellitus without complication [facility])     Past Surgical History:  Procedure Laterality Date   ABDOMINAL AORTOGRAM W/LOWER EXTREMITY N/A 07/26/2021   Procedure: ABDOMINAL AORTOGRAM W/ Bilateral LOWER EXTREMITY Runoff;  Surgeon: Victorino Sparrow, MD;  Location: [facility];  Service: Cardiovascular;  Laterality: N/A;   ABDOMINAL AORTOGRAM W/LOWER EXTREMITY N/A 02/21/2022   Procedure: ABDOMINAL AORTOGRAM W/LOWER EXTREMITY;  Surgeon: Victorino Sparrow, MD;  Location: [facility] INVASIVE [facility];  Service: Cardiovascular;  Laterality: N/A;   PERIPHERAL VASCULAR ATHERECTOMY Left 02/21/2022   Procedure: PERIPHERAL VASCULAR ATHERECTOMY;  Surgeon: Victorino Sparrow, MD;  Location[facility] IN[facility]AS[facility];  Service: Cardiovascular;  Laterality: Left;  tibial   PERIPHERAL VASCULAR BALLOON ANGIOPLASTY Right  07/26/2021   Procedure: PERIPHERAL VASCULAR BALLOON ANGIOPLASTY;  Surgeon: Victorino Sparrow, MD;  Location: [facility] INVASIVE [facility] LAB;  Service: Cardiovascular;  Laterality: Right;  Posterior tibial   PERIPHERAL VASCULAR BALLOON ANGIOPLASTY Left 02/21/2022   Procedure: PERIPHERAL VASCULAR BALLOON ANGIOPLASTY;  Surgeon: Victorino Sparrow, MD;  Location: [facility] INVASIVE [facility] LAB;  Service: Cardiovascular;  Laterality: Left;  SFa and popliteal   PERIPHERAL VASCULAR INTERVENTION Right 07/26/2021   Procedure: PERIPHERAL VASCULAR INTERVENTION;  Surgeon: Victorino Sparrow, MD;  Location: [facility] INVASIVE [facility] LAB;  Service: Cardiovascular;  Laterality: Right;  Superficial femoral    Social History   Socioeconomic History   Marital status: Widowed    Spouse name: Not on file   Number of children: Not on file   Years of education: Not on file   Highest education level: Not on file  Occupational History   Not on file  Tobacco Use   Smoking status: Never   Smokeless tobacco: Never  Vaping Use   Vaping Use: Never used  Substance and Sexual Activity   Alcohol use: No   Drug use: Yes    Types: Marijuana   Sexual activity: Not on file  Other Topics Concern   Not on file  Social History Narrative   Not on file   Social Determinants of Health   Financial Resource Strain: Not on file  Food Insecurity: Not on file  Transportation Needs: Not on file  Physical Activity: Not on file  Stress: Not on file  Social Connections: Not on file  Intimate Partner Violence:  Not on file    Family History  Problem Relation Age of Onset   Stroke Mother    Diabetes Mother    Heart failure Father     Current Outpatient Medications  Medication Sig Dispense Refill   ALPRAZolam (XANAX) 0.5 MG tablet Take 0.5 mg by mouth 2 (two) times daily.     aspirin 81 MG EC tablet Take 1 tablet (81 mg total) by mouth daily. Swallow whole. 30 tablet 11   atorvastatin (LIPITOR) 80 MG tablet Take 80 mg by mouth daily.     JARDIANCE 25 MG  TABS tablet Take 25 mg by mouth daily.     lisinopril (ZESTRIL) 10 MG tablet Take 10 mg by mouth daily.     Menthol, Topical Analgesic, (BLUE-EMU MAXIMUM STRENGTH) 2.5 % LIQD Apply 1 application. topically daily as needed (pain).     naproxen sodium (ALEVE) 220 MG tablet Take 220 mg by mouth daily as needed (pain).     omeprazole (PRILOSEC) 20 MG capsule Take 20 mg by mouth daily as needed (indigestion).     XARELTO 20 MG TABS tablet Take 1 tablet (20 mg total) by mouth daily with supper. Restart tomorrow 30 tablet    No current facility-administered medications for this visit.    No Known Allergies   REVIEW OF SYSTEMS:   [X]  denotes positive finding, [ ]  denotes negative finding Cardiac  Comments:  Chest pain or chest pressure:    Shortness of breath upon exertion:    Short of breath when lying flat:    Irregular heart rhythm:        Vascular    Pain in calf, thigh, or hip brought on by ambulation:    Pain in feet at night that wakes you up from your sleep:     Blood clot in your veins:    Leg swelling:         Pulmonary    Oxygen at home:    Productive cough:     Wheezing:         Neurologic    Sudden weakness in arms or legs:     Sudden numbness in arms or legs:     Sudden onset of difficulty speaking or slurred speech:    Temporary loss of vision in one eye:     Problems with dizziness:         Gastrointestinal    Blood in stool:     Vomited blood:         Genitourinary    Burning when urinating:     Blood in urine:        Psychiatric    Major depression:         Hematologic    Bleeding problems:    Problems with blood clotting too easily:        Skin    Rashes or ulcers:        Constitutional    Fever or chills:      PHYSICAL EXAMINATION:  There were no vitals filed for this visit.   General:  WDWN in NAD; vital signs documented above Gait: Not observed HENT: WNL, normocephalic Pulmonary: normal non-labored breathing , without Rales, rhonchi,   wheezing Cardiac: regular HR Abdomen: soft, NT, no masses Skin: without rashes Vascular Exam/Pulses:  Right Left  Radial 2+ (normal) 2+ (normal)  Ulnar 2+ (normal) 2+ (normal)  Femoral 2+ (normal) 2+ (normal)  Popliteal    DP absent 2+ (normal)  PT absent 1+ (  weak)   Extremities: without ischemic changes, without Gangrene , without cellulitis; with right heal cracked from dry skin Musculoskeletal: no muscle wasting or atrophy  Neurologic: A&O X 3;  No focal weakness or paresthesias are detected Psychiatric:  The pt has Normal affect.   Non-Invasive Vascular Imaging:   Right lower extremity duplex ultrasonography reviewed, no stenosis in the right sided SFA stent Appears to be stenosis versus occlusion distally in the popliteal artery. Ankle-brachial index independently reviewed demonstrating significant decrease in toe pressure.     ASSESSMENT/PLAN: Daniel Byrd is a 61 y.o. male presenting in follow-up status post 07/26/2021 Right SFA stenting, popliteal artery, posterior tibial artery, peroneal artery, medial plantar artery balloon angioplasty for Rutherford 5 critical limb ischemia.  Since his last visit, Daniel Byrd wounds have nearly healed, he continues to have residual paresthesias in the right great toe.  He has mild claudication, however admits he needs to walk more.  ABI and duplex ultrasound were reviewed demonstrating patent SFA stent with concern for popliteal artery stenosis versus occlusion.  There is a significant decrease in toe pressure with monophasic waveforms in the tibial arteries.  I am concerned that such a lesion could cause stent thrombosis.  He would benefit from right lower extremity angiography in an effort to assess, and improve lower extremity perfusion for primary assisted patency of his right SFA stent  After discussing the risks and benefits, Rosanne AshingJim elected to proceed.   Victorino SparrowJoshua E Sultana Tierney, MD [facility] [phone]

## 2022-03-30 ENCOUNTER — Encounter: Payer: Self-pay | Admitting: Vascular Surgery

## 2022-03-30 ENCOUNTER — Ambulatory Visit (INDEPENDENT_AMBULATORY_CARE_PROVIDER_SITE_OTHER): Payer: Medicare Other | Admitting: Vascular Surgery

## 2022-03-30 ENCOUNTER — Ambulatory Visit (HOSPITAL_COMMUNITY)
Admission: RE | Admit: 2022-03-30 | Discharge: 2022-03-30 | Disposition: A | Discharge status: Home / Self Care | Payer: Medicare Other | Source: Ambulatory Visit | Attending: Vascular Surgery | Admitting: Vascular Surgery

## 2022-03-30 VITALS — BP 118/78 | HR 72 | Temp 97.8°F | Resp 20 | Ht 69.0 in | Wt 167.0 lb

## 2022-03-30 DIAGNOSIS — I70221 Atherosclerosis of native arteries of extremities with rest pain, right leg: Secondary | ICD-10-CM | POA: Diagnosis not present

## 2022-03-30 DIAGNOSIS — I739 Peripheral vascular disease, unspecified: Secondary | ICD-10-CM | POA: Diagnosis not present

## 2022-04-05 ENCOUNTER — Other Ambulatory Visit: Payer: Self-pay | Admitting: *Deleted

## 2022-04-05 DIAGNOSIS — I739 Peripheral vascular disease, unspecified: Secondary | ICD-10-CM

## 2022-04-05 DIAGNOSIS — I70221 Atherosclerosis of native arteries of extremities with rest pain, right leg: Secondary | ICD-10-CM

## 2022-11-30 DIAGNOSIS — E1165 Type 2 diabetes mellitus with hyperglycemia: Secondary | ICD-10-CM | POA: Diagnosis not present

## 2022-11-30 DIAGNOSIS — E785 Hyperlipidemia, unspecified: Secondary | ICD-10-CM | POA: Diagnosis not present

## 2022-11-30 DIAGNOSIS — I739 Peripheral vascular disease, unspecified: Secondary | ICD-10-CM | POA: Diagnosis not present

## 2022-11-30 DIAGNOSIS — K219 Gastro-esophageal reflux disease without esophagitis: Secondary | ICD-10-CM | POA: Diagnosis not present

## 2022-11-30 DIAGNOSIS — Z6824 Body mass index (BMI) 24.0-24.9, adult: Secondary | ICD-10-CM | POA: Diagnosis not present

## 2022-11-30 DIAGNOSIS — I10 Essential (primary) hypertension: Secondary | ICD-10-CM | POA: Diagnosis not present

## 2022-11-30 DIAGNOSIS — I890 Lymphedema, not elsewhere classified: Secondary | ICD-10-CM | POA: Diagnosis not present

## 2023-02-22 ENCOUNTER — Encounter: Payer: Self-pay | Admitting: Vascular Surgery

## 2023-02-22 ENCOUNTER — Ambulatory Visit (INDEPENDENT_AMBULATORY_CARE_PROVIDER_SITE_OTHER)
Admission: RE | Admit: 2023-02-22 | Discharge: 2023-02-22 | Disposition: A | Discharge status: Home / Self Care | Payer: Medicare Other | Source: Ambulatory Visit | Attending: Vascular Surgery | Admitting: Vascular Surgery

## 2023-02-22 ENCOUNTER — Other Ambulatory Visit: Payer: Self-pay | Admitting: Vascular Surgery

## 2023-02-22 ENCOUNTER — Ambulatory Visit (HOSPITAL_COMMUNITY)
Admission: RE | Admit: 2023-02-22 | Discharge: 2023-02-22 | Disposition: A | Discharge status: Home / Self Care | Payer: Medicare Other | Source: Ambulatory Visit | Attending: Vascular Surgery | Admitting: Vascular Surgery

## 2023-02-22 ENCOUNTER — Ambulatory Visit: Payer: Medicare Other | Admitting: Vascular Surgery

## 2023-02-22 VITALS — BP 164/92 | HR 99 | Temp 98.0°F | Resp 20 | Ht 69.0 in | Wt 169.0 lb

## 2023-02-22 DIAGNOSIS — I739 Peripheral vascular disease, unspecified: Secondary | ICD-10-CM | POA: Diagnosis not present

## 2023-02-22 DIAGNOSIS — I70221 Atherosclerosis of native arteries of extremities with rest pain, right leg: Secondary | ICD-10-CM

## 2023-02-22 LAB — VAS US ABI WITH/WO TBI

## 2023-02-22 NOTE — Progress Notes (Signed)
Office Note    HPI: Daniel Byrd is a 62 y.o. (Jun 19, 1961) male presenting in follow-up status post 07/26/2021 Right SFA stenting, popliteal artery, posterior tibial artery, peroneal artery, medial plantar artery balloon angioplasty for Rutherford 5 critical limb ischemia.Mostly recently Daniel Byrd underwent repeat angiogram for RLE rutherfird 4 CLI demonstrating no outflow to the foot.   On exam today, Daniel Byrd was accompanied by his wife's sister (she has been taking care of him since his wife's death).  Since his last visit, Daniel Byrd has been doing well. He has been trying to walk several blocks daily.  The wounds on his left foot have healed. He denies erythema, induration, concern for infection.  The pt is  on a statin for cholesterol management.  The pt is  on a daily aspirin.   Other AC:  Xarelto The pt is is on medication for hypertension.   The pt is  diabetic.  Tobacco hx:  none  Past Medical History:  Diagnosis Date   Abscess    Diabetes mellitus without complication [facility])     Past Surgical History:  Procedure Laterality Date   ABDOMINAL AORTOGRAM W/LOWER EXTREMITY N/A 07/26/2021   Procedure: ABDOMINAL AORTOGRAM W/ Bilateral LOWER EXTREMITY [place];  Surgeon: Victorino Sparrow, MD;  Location: [facility] INVASIVE CV LAB;  Service: Cardiovascular;  Laterality: N/A;   ABDOMINAL AORTOGRAM W/LOWER EXTREMITY N/A 02/21/2022   Procedure: ABDOMINAL AORTOGRAM W/LOWER EXTREMITY;  Surgeon: Victorino Sparrow, MD;  Location: [facility] INVASIVE CV LAB;  Service: Cardiovascular;  Laterality: N/A;   PERIPHERAL VASCULAR ATHERECTOMY Left 02/21/2022   Procedure: PERIPHERAL VASCULAR ATHERECTOMY;  Surgeon: Victorino Sparrow, MD;  Location: [facility] INVASIVE CV LAB;  Service: Cardiovascular;  Laterality: Left;  tibial   PERIPHERAL VASCULAR BALLOON ANGIOPLASTY Right 07/26/2021   Procedure: PERIPHERAL VASCULAR BALLOON ANGIOPLASTY;  Surgeon: Victorino Sparrow, MD;  Location: [facility] INVASIVE CV LAB;  Service: Cardiovascular;  Laterality: Right;   Posterior tibial   PERIPHERAL VASCULAR BALLOON ANGIOPLASTY Left 02/21/2022   Procedure: PERIPHERAL VASCULAR BALLOON ANGIOPLASTY;  Surgeon: Victorino Sparrow, MD;  Location: [facility] INVASIVE CV LAB;  Service: Cardiovascular;  Laterality: Left;  SFa and popliteal   PERIPHERAL VASCULAR INTERVENTION Right 07/26/2021   Procedure: PERIPHERAL VASCULAR INTERVENTION;  Surgeon: Victorino Sparrow, MD;  Location: [facility] INVASIVE CV LAB;  Service: Cardiovascular;  Laterality: Right;  Superficial femoral    Social History   Socioeconomic History   Marital status: Widowed    Spouse name: Not on file   Number of children: Not on file   Years of education: Not on file   Highest education level: Not on file  Occupational History   Not on file  Tobacco Use   Smoking status: Never   Smokeless tobacco: Never  Vaping Use   Vaping Use: Never used  Substance and Sexual Activity   Alcohol use: No   Drug use: Yes    Types: Marijuana   Sexual activity: Not on file  Other Topics Concern   Not on file  Social History Narrative   Not on file   Social Determinants of Health   Financial Resource Strain: Not on file  Food Insecurity: Not on file  Transportation Needs: Not on file  Physical Activity: Not on file  Stress: Not on file  Social Connections: Not on file  Intimate Partner Violence: Not on file    Family History  Problem Relation Age of Onset   Stroke Mother    Diabetes Mother    Heart failure Father  Current Outpatient Medications  Medication Sig Dispense Refill   ALPRAZolam (XANAX) 0.5 MG tablet Take 0.5 mg by mouth 2 (two) times daily.     aspirin 81 MG EC tablet Take 1 tablet (81 mg total) by mouth daily. Swallow whole. 30 tablet 11   atorvastatin (LIPITOR) 80 MG tablet Take 80 mg by mouth daily.     JARDIANCE 25 MG TABS tablet Take 25 mg by mouth daily.     lisinopril (ZESTRIL) 10 MG tablet Take 10 mg by mouth daily.     Menthol, Topical Analgesic, (BLUE-EMU MAXIMUM STRENGTH) 2.5 % LIQD  Apply 1 application. topically daily as needed (pain).     naproxen sodium (ALEVE) 220 MG tablet Take 220 mg by mouth daily as needed (pain).     omeprazole (PRILOSEC) 20 MG capsule Take 20 mg by mouth daily as needed (indigestion).     XARELTO 20 MG TABS tablet Take 1 tablet (20 mg total) by mouth daily with supper. Restart tomorrow 30 tablet    No current facility-administered medications for this visit.    No Known Allergies   REVIEW OF SYSTEMS:   [X]  denotes positive finding, [ ]  denotes negative finding Cardiac  Comments:  Chest pain or chest pressure:    Shortness of breath upon exertion:    Short of breath when lying flat:    Irregular heart rhythm:        Vascular    Pain in calf, thigh, or hip brought on by ambulation:    Pain in feet at night that wakes you up from your sleep:     Blood clot in your veins:    Leg swelling:         Pulmonary    Oxygen at home:    Productive cough:     Wheezing:         Neurologic    Sudden weakness in arms or legs:     Sudden numbness in arms or legs:     Sudden onset of difficulty speaking or slurred speech:    Temporary loss of vision in one eye:     Problems with dizziness:         Gastrointestinal    Blood in stool:     Vomited blood:         Genitourinary    Burning when urinating:     Blood in urine:        Psychiatric    Major depression:         Hematologic    Bleeding problems:    Problems with blood clotting too easily:        Skin    Rashes or ulcers:        Constitutional    Fever or chills:      PHYSICAL EXAMINATION:  Vitals:   02/22/23 1414  BP: (!) 164/92  Pulse: 99  Resp: 20  Temp: 98 F (36.7 C)  SpO2: (!) 59%  Weight: 169 lb (76.7 kg)  Height: 5\' 9"  (1.753 m)     General:  WDWN in NAD; vital signs documented above Gait: Not observed HENT: WNL, normocephalic Pulmonary: normal non-labored breathing , without Rales, rhonchi,  wheezing Cardiac: regular HR Abdomen: soft, NT, no  masses Skin: without rashes Vascular Exam/Pulses:  Right Left  Radial 2+ (normal) 2+ (normal)  Ulnar 2+ (normal) 2+ (normal)  Femoral 2+ (normal) 2+ (normal)  Popliteal    DP absent 2+ (normal)  PT absent 1+ (weak)  Extremities: without ischemic changes, without Gangrene , without cellulitis; with right heal cracked from dry skin Musculoskeletal: no muscle wasting or atrophy  Neurologic: A&O X 3;  No focal weakness or paresthesias are detected Psychiatric:  The pt has Normal affect.   Non-Invasive Vascular Imaging:     ABI Findings:  +---------+------------------+-----+----------+--------+  Right   Rt Pressure (mmHg)IndexWaveform  Comment   +---------+------------------+-----+----------+--------+  Brachial 144                                        +---------+------------------+-----+----------+--------+  PTA     254               1.74 monophasic          +---------+------------------+-----+----------+--------+  DP      254               1.74 biphasic            +---------+------------------+-----+----------+--------+  Great Toe90                0.62 Abnormal            +---------+------------------+-----+----------+--------+   +---------+------------------+-----+----------+-------+  Left    Lt Pressure (mmHg)IndexWaveform  Comment  +---------+------------------+-----+----------+-------+  Brachial 146                                       +---------+------------------+-----+----------+-------+  PTA     68                0.47 monophasic         +---------+------------------+-----+----------+-------+  DP      254               1.74 triphasic          +---------+------------------+-----+----------+-------+  Great Toe87                0.60 Abnormal           +---------+------------------+-----+----------+-------+    +-----------+--------+-----+---------------+----------+--------+  RIGHT     PSV  cm/sRatioStenosis       Waveform  Comments  +-----------+--------+-----+---------------+----------+--------+  CFA Prox   110                         triphasic           +-----------+--------+-----+---------------+----------+--------+  SFA Distal 73                          biphasic            +-----------+--------+-----+---------------+----------+--------+  POP Prox   70                          biphasic            +-----------+--------+-----+---------------+----------+--------+  POP Mid    54                          biphasic            +-----------+--------+-----+---------------+----------+--------+  POP Distal 231          50-74% stenosistriphasic           +-----------+--------+-----+---------------+----------+--------+  TP Trunk   91  triphasic           +-----------+--------+-----+---------------+----------+--------+  ATA Distal 24                          monophasic          +-----------+--------+-----+---------------+----------+--------+  PTA Distal 18                          monophasic          +-----------+--------+-----+---------------+----------+--------+  PERO Distal18                          monophasic          +-----------+--------+-----+---------------+----------+--------+       Right Stent(s): SFA  +---------------+--++--------++  Prox to Stent  62biphasic  +---------------+--++--------++  Proximal Stent 78biphasic  +---------------+--++--------++  Mid Stent      75biphasic  +---------------+--++--------++  Distal Stent   69biphasic  +---------------+--++--------++  Distal to Stent81biphasic  +---------------+--++--------++         ASSESSMENT/PLAN: Daniel Byrd is a 62 y.o. male presenting in follow-up status post 07/26/2021 Right SFA stenting, popliteal artery, posterior tibial artery, peroneal artery, medial plantar artery balloon  angioplasty for Rutherford 5 critical limb ischemia.  Most recently he underwent repeat right lower extremity angiography for Rutherford 5 critical limb ischemia.  I was able to open the anterior tibial artery with balloon angioplasty and laser atherectomy however outflow was poor.   On exam today, Daniel Byrd was doing well.  Wounds have healed.  He has unchanged ABIs when compared to his last visit, and no stenosis in the previously placed right SFA stent.  There is a slight elevation of velocity at the native popliteal artery  Daniel Byrd is aware his right lower extremity has been maximally revascularized and at this point there are no further endovascular or open options available due to the poor outflow in his foot.  I am happy to see him doing so well, however he knows he is at high risk for future problems including amputation.  I plan to see Daniel Byrd in 6 months, he was asked to call my office should any questions or concerns arise as I would be happy to see him earlier.   Victorino Sparrow, MD [facility] [phone]

## 2023-03-04 ENCOUNTER — Other Ambulatory Visit: Payer: Self-pay

## 2023-03-04 DIAGNOSIS — I10 Essential (primary) hypertension: Secondary | ICD-10-CM | POA: Diagnosis not present

## 2023-03-04 DIAGNOSIS — K219 Gastro-esophageal reflux disease without esophagitis: Secondary | ICD-10-CM | POA: Diagnosis not present

## 2023-03-04 DIAGNOSIS — Z6824 Body mass index (BMI) 24.0-24.9, adult: Secondary | ICD-10-CM | POA: Diagnosis not present

## 2023-03-04 DIAGNOSIS — E1165 Type 2 diabetes mellitus with hyperglycemia: Secondary | ICD-10-CM | POA: Diagnosis not present

## 2023-03-04 DIAGNOSIS — I70221 Atherosclerosis of native arteries of extremities with rest pain, right leg: Secondary | ICD-10-CM

## 2023-03-04 DIAGNOSIS — I739 Peripheral vascular disease, unspecified: Secondary | ICD-10-CM | POA: Diagnosis not present

## 2023-03-04 DIAGNOSIS — R030 Elevated blood-pressure reading, without diagnosis of hypertension: Secondary | ICD-10-CM | POA: Diagnosis not present

## 2023-03-04 DIAGNOSIS — I890 Lymphedema, not elsewhere classified: Secondary | ICD-10-CM | POA: Diagnosis not present

## 2023-03-04 DIAGNOSIS — E785 Hyperlipidemia, unspecified: Secondary | ICD-10-CM | POA: Diagnosis not present

## 2023-04-20 IMAGING — MR MR FOOT*R* W/O CM
4 of 6 series · 19 of 40 positions shown · non-contrast
Comparison: Radiographs 07/26/2021

CLINICAL DATA: Foot pain and swelling. Diabetic.

EXAM:
MRI OF THE RIGHT FOREFOOT WITHOUT CONTRAST
TECHNIQUE: Multiplanar, multisequence MR imaging of the right foot was
performed. No intravenous contrast was administered.

[Series 2: T1 · coronal · 3.0mm · 0.27mm/px · 4 of 78 slices shown (1 of 2)]
[im 1/78]
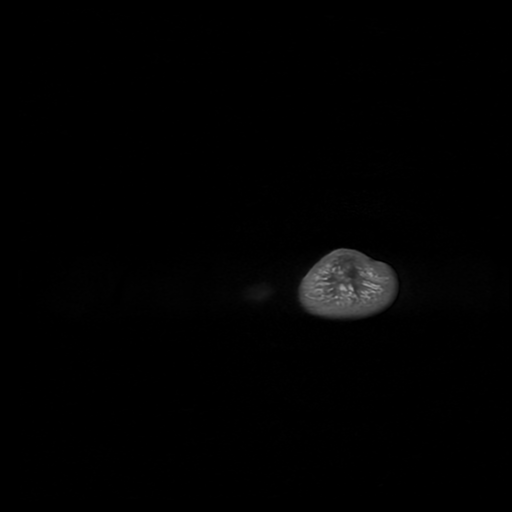
[im 9/78]
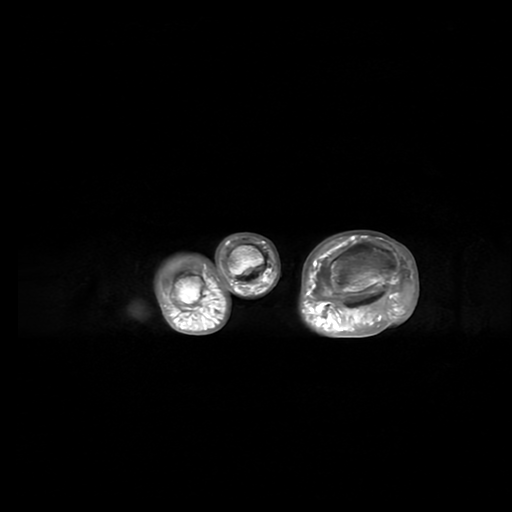
[im 43/78]
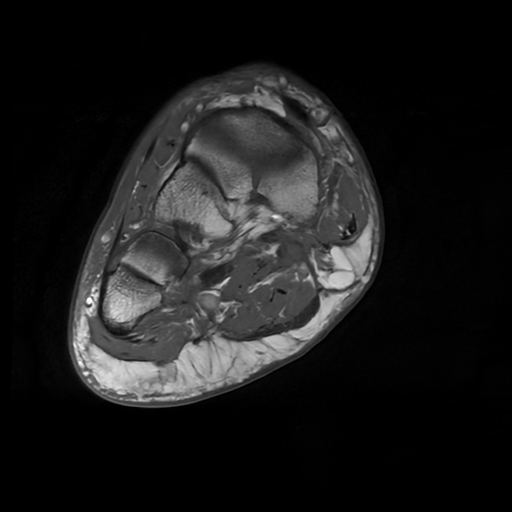
[im 69/78]
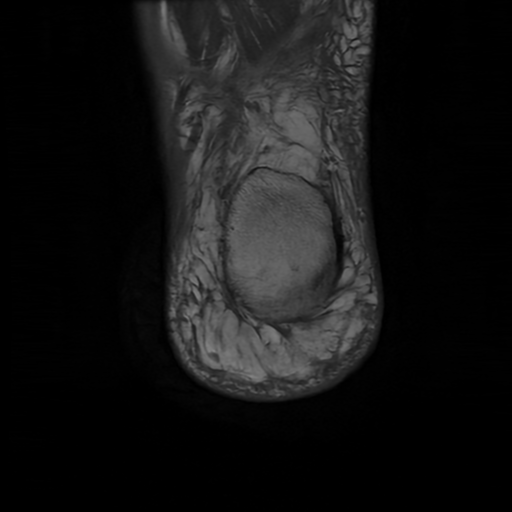

[Series 3: T1 fat-sat · coronal · non-contrast · 3.0mm · 0.27mm/px · 3 of 78 slices shown]
[im 10/78]
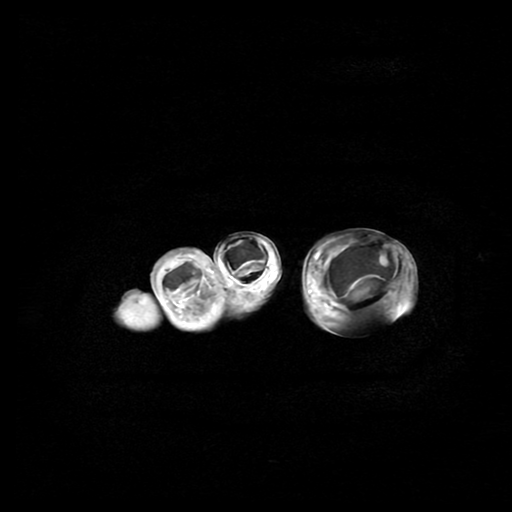
[im 39/78]
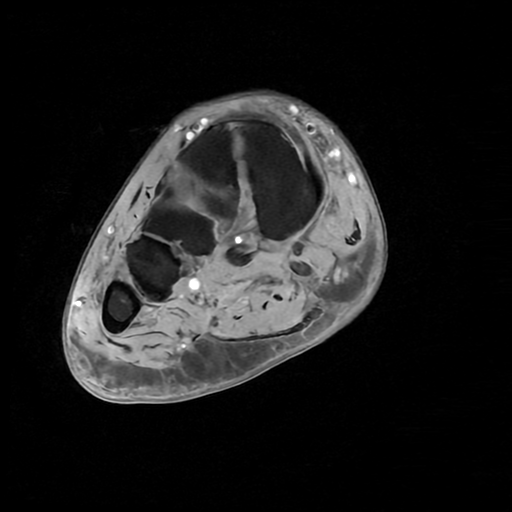
[im 68/78]
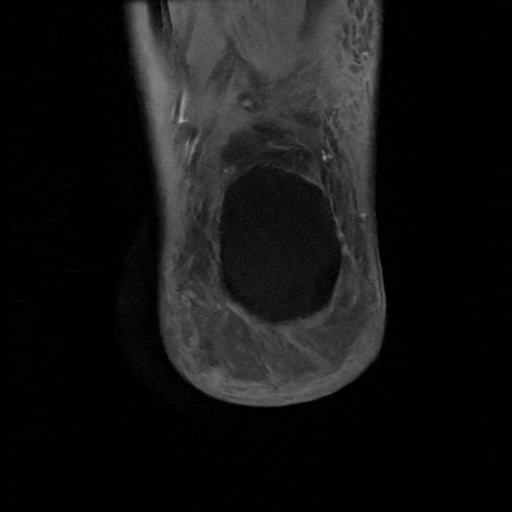

[Series 4: T2 fat-sat · coronal · 3.0mm · 0.27mm/px · 9 of 78 slices shown]
[im 1/78]
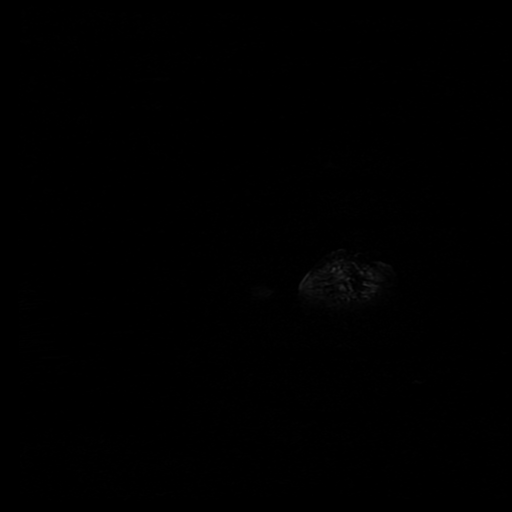
[im 10/78]
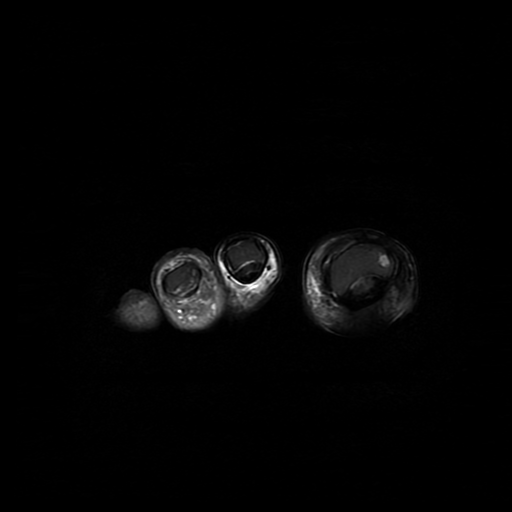
[im 20/78]
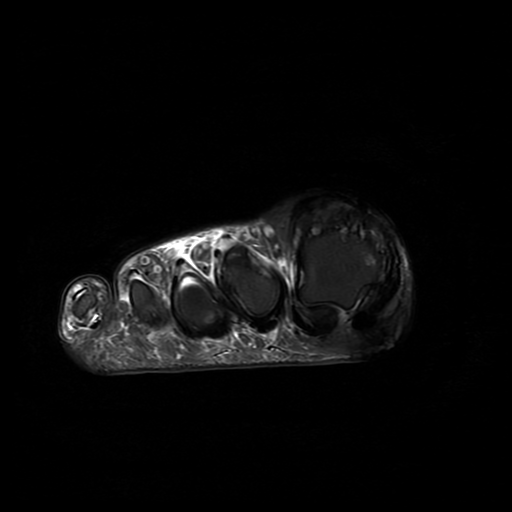
[im 29/78]
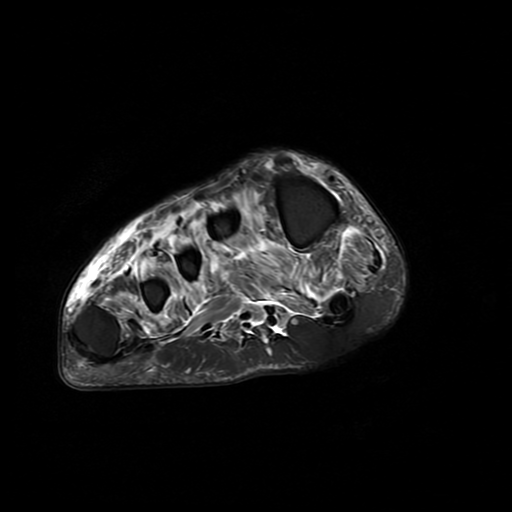
[im 39/78]
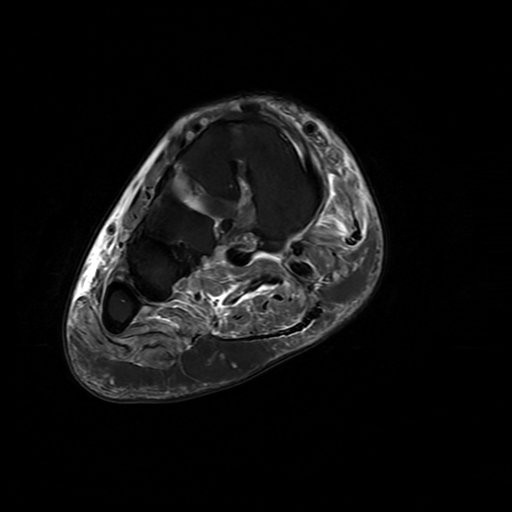
[im 49/78]
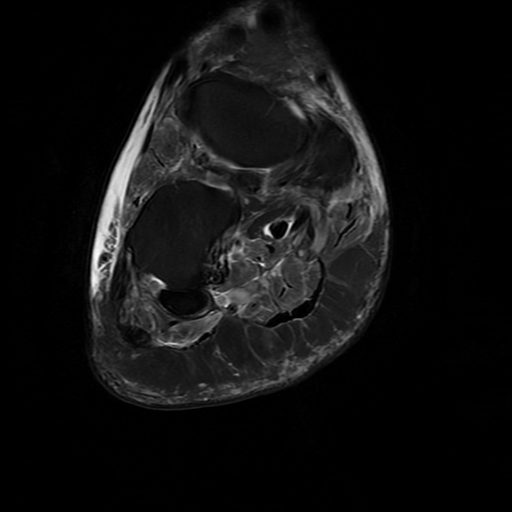
[im 58/78]
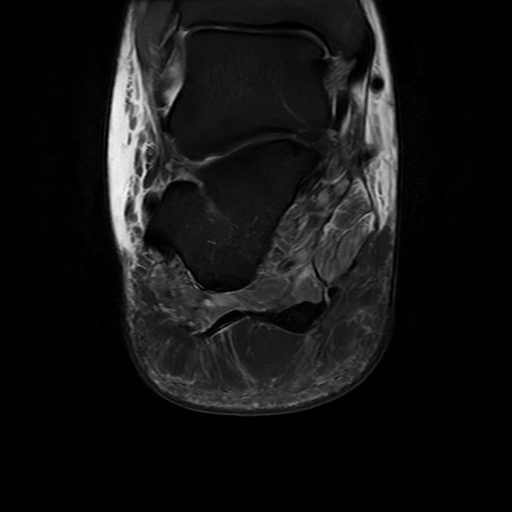
[im 68/78]
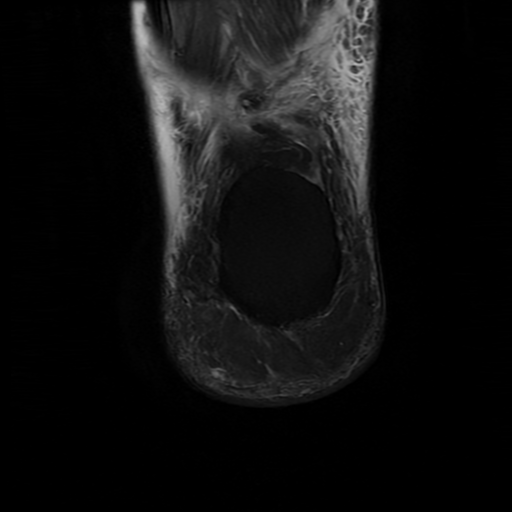
[im 78/78]
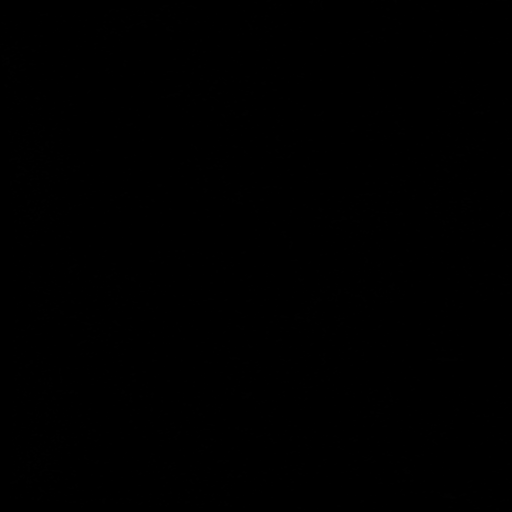

[Series 6: T1 · axial · 3.0mm · 0.55mm/px · z∈[-89,+30]mm · 3 of 35 slices shown (2 of 2)]
[im 1/35]
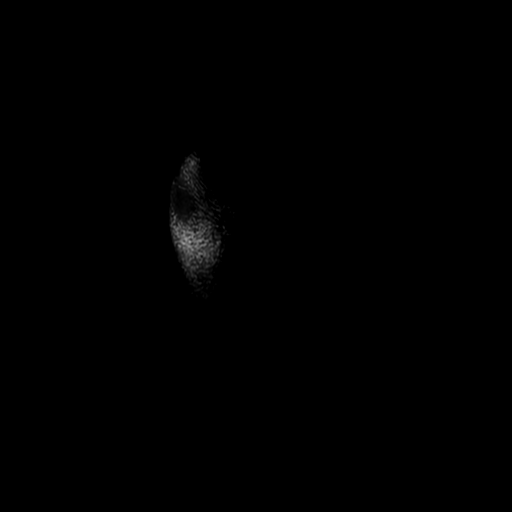
[im 23/35]
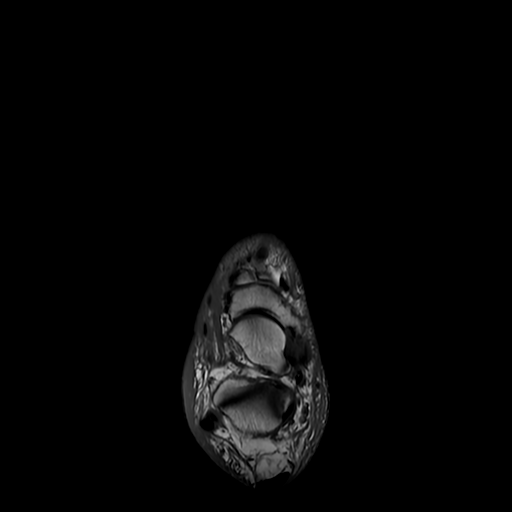
[im 35/35]
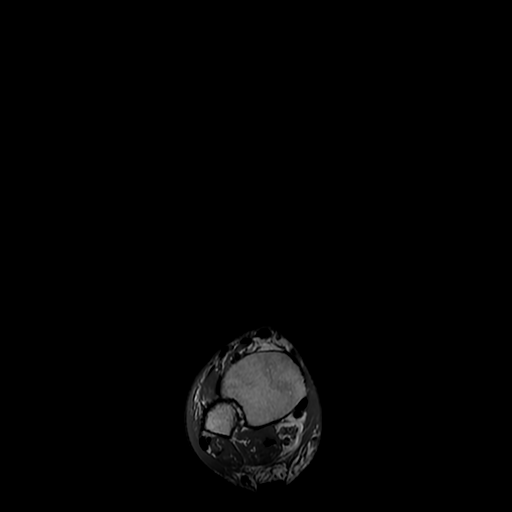

[19 of 40 positions shown; findings below may reference images not displayed]

FINDINGS: The bony structures are intact. No findings suspicious for septic
arthritis or osteomyelitis. Moderate degenerative changes at the
first MTP joint.

Mild diffuse subcutaneous soft tissue swelling/edema/fluid more
notably around the ankle and suggesting cellulitis. No discrete
fluid collection to suggest a drainable soft tissue abscess.

Diffuse myofasciitis without definite findings for pyomyositis.

The major tendons and ligaments are grossly intact.

The tibiotalar and subtalar joints are maintained. The sinus tarsi
is grossly normal.
IMPRESSION: 1. Diffuse cellulitis and myofasciitis but no discrete drainable
soft tissue abscess or pyomyositis.
2. No findings for septic arthritis or osteomyelitis.
3. Moderate degenerative changes at the first MTP joint.

## 2023-05-22 NOTE — Progress Notes (Signed)
Office Note    HPI: Daniel Byrd is a 62 y.o. (Sep 21, 1961) male presenting in follow-up status post 07/26/2021 Right SFA stenting, popliteal artery, posterior tibial artery, peroneal artery, medial plantar artery balloon angioplasty for Rutherford 5 critical limb ischemia.Mostly recently Tim underwent repeat angiogram for RLE rutherfird 4 CLI demonstrating no outflow to the foot.   On exam today, [name] was accompanied by his wife's sister (she has been taking care of him since his wife's death).  Since his last visit, Daniel Byrd has been doing well. He has been trying to walk several blocks daily and mows his yard. He has cut out sugar from his diet.  No wounds. Mild claudication, no rest pain.  The pt is  on a statin for cholesterol management.  The pt is  on a daily aspirin.   Other AC:  Xarelto The pt is is on medication for hypertension.   The pt is  diabetic.  Tobacco hx:  none  Past Medical History:  Diagnosis Date   Abscess    Diabetes mellitus without complication [facility])     Past Surgical History:  Procedure Laterality Date   ABDOMINAL AORTOGRAM W/LOWER EXTREMITY N/A 07/26/2021   Procedure: ABDOMINAL AORTOGRAM W/ Bilateral LOWER EXTREMITY [place];  Surgeon: Victorino Sparrow, MD;  Location: [facility] INVASIVE CV LAB;  Service: Cardiovascular;  Laterality: N/A;   ABDOMINAL AORTOGRAM W/LOWER EXTREMITY N/A 02/21/2022   Procedure: ABDOMINAL AORTOGRAM W/LOWER EXTREMITY;  Surgeon: Victorino Sparrow, MD;  Location: [facility] INVASIVE CV LAB;  Service: Cardiovascular;  Laterality: N/A;   PERIPHERAL VASCULAR ATHERECTOMY Left 02/21/2022   Procedure: PERIPHERAL VASCULAR ATHERECTOMY;  Surgeon: Victorino Sparrow, MD;  Location: [facility] INVASIVE CV LAB;  Service: Cardiovascular;  Laterality: Left;  tibial   PERIPHERAL VASCULAR BALLOON ANGIOPLASTY Right 07/26/2021   Procedure: PERIPHERAL VASCULAR BALLOON ANGIOPLASTY;  Surgeon: Victorino Sparrow, MD;  Location: [facility] INVASIVE CV LAB;  Service: Cardiovascular;  Laterality: Right;   Posterior tibial   PERIPHERAL VASCULAR BALLOON ANGIOPLASTY Left 02/21/2022   Procedure: PERIPHERAL VASCULAR BALLOON ANGIOPLASTY;  Surgeon: Victorino Sparrow, MD;  Location: [facility] INVASIVE CV LAB;  Service: Cardiovascular;  Laterality: Left;  SFa and popliteal   PERIPHERAL VASCULAR INTERVENTION Right 07/26/2021   Procedure: PERIPHERAL VASCULAR INTERVENTION;  Surgeon: Victorino Sparrow, MD;  Location: [facility] INVASIVE CV LAB;  Service: Cardiovascular;  Laterality: Right;  Superficial femoral    Social History   Socioeconomic History   Marital status: Widowed    Spouse name: Not on file   Number of children: Not on file   Years of education: Not on file   Highest education level: Not on file  Occupational History   Not on file  Tobacco Use   Smoking status: Never   Smokeless tobacco: Never  Vaping Use   Vaping status: Never Used  Substance and Sexual Activity   Alcohol use: No   Drug use: Yes    Types: Marijuana   Sexual activity: Not on file  Other Topics Concern   Not on file  Social History Narrative   Not on file   Social Determinants of Health   Financial Resource Strain: Low Risk  (09/06/2021)   Received from [facility], [facility]   Overall Financial Resource Strain (CARDIA)    Difficulty of Paying Living Expenses: Not hard at all  Food Insecurity: No Food Insecurity (09/06/2021)   Received from [facility], [facility]   Hunger Vital Sign    Worried About Running Out of Food  in the Last Year: Never true    Ran Out of Food in the Last Year: Never true  Transportation Needs: No Transportation Needs (09/06/2021)   Received from [facility], [facility]   [facility] - Transportation    Lack of Transportation (Medical): No    Lack of Transportation (Non-Medical): No  Physical Activity: Inactive (09/06/2021)   Received from [facility], [facility]   Exercise Vital Sign    Days of Exercise per Week: 0 days    Minutes of Exercise per Session: 0 min   Stress: No Stress Concern Present (09/06/2021)   Received from [facility], [facility] - Occupational Stress Questionnaire    Feeling of Stress : Not at all  Social Connections: Moderately Isolated (09/06/2021)   Received from [facility], [facility]   Social Connection and Isolation Panel [NHANES]    Frequency of Communication with Friends and Family: More than three times a week    Frequency of Social Gatherings with Friends and Family: Three times a week    Attends Religious Services: More than 4 times per year    Active Member of Clubs or Organizations: No    Attends Banker Meetings: Never    Marital Status: Widowed  Intimate Partner Violence: Not At Risk (09/06/2021)   Received from [facility], [facility]   Humiliation, Afraid, Rape, and Kick questionnaire    Fear of Current or Ex-Partner: No    Emotionally Abused: No    Physically Abused: No    Sexually Abused: No    Family History  Problem Relation Age of Onset   Stroke Mother    Diabetes Mother    Heart failure Father     Current Outpatient Medications  Medication Sig Dispense Refill   ALPRAZolam (XANAX) 0.5 MG tablet Take 0.5 mg by mouth 2 (two) times daily.     aspirin 81 MG EC tablet Take 1 tablet (81 mg total) by mouth daily. Swallow whole. 30 tablet 11   atorvastatin (LIPITOR) 80 MG tablet Take 80 mg by mouth daily.     JARDIANCE 25 MG TABS tablet Take 25 mg by mouth daily.     lisinopril (ZESTRIL) 10 MG tablet Take 10 mg by mouth daily.     Menthol, Topical Analgesic, (BLUE-EMU MAXIMUM STRENGTH) 2.5 % LIQD Apply 1 application. topically daily as needed (pain).     naproxen sodium (ALEVE) 220 MG tablet Take 220 mg by mouth daily as needed (pain).     omeprazole (PRILOSEC) 20 MG capsule Take 20 mg by mouth daily as needed (indigestion).     XARELTO 20 MG TABS tablet Take 1 tablet (20 mg total) by mouth daily with supper. Restart  tomorrow 30 tablet    No current facility-administered medications for this visit.    No Known Allergies   REVIEW OF SYSTEMS:   [X]  denotes positive finding, [ ]  denotes negative finding Cardiac  Comments:  Chest pain or chest pressure:    Shortness of breath upon exertion:    Short of breath when lying flat:    Irregular heart rhythm:        Vascular    Pain in calf, thigh, or hip brought on by ambulation:    Pain in feet at night that wakes you up from your sleep:     Blood clot in your veins:    Leg swelling:  Pulmonary    Oxygen at home:    Productive cough:     Wheezing:         Neurologic    Sudden weakness in arms or legs:     Sudden numbness in arms or legs:     Sudden onset of difficulty speaking or slurred speech:    Temporary loss of vision in one eye:     Problems with dizziness:         Gastrointestinal    Blood in stool:     Vomited blood:         Genitourinary    Burning when urinating:     Blood in urine:        Psychiatric    Major depression:         Hematologic    Bleeding problems:    Problems with blood clotting too easily:        Skin    Rashes or ulcers:        Constitutional    Fever or chills:      PHYSICAL EXAMINATION:  There were no vitals filed for this visit.    General:  WDWN in NAD; vital signs documented above Gait: Not observed HENT: WNL, normocephalic Pulmonary: normal non-labored breathing , without Rales, rhonchi,  wheezing Cardiac: regular HR Abdomen: soft, NT, no masses Skin: without rashes Vascular Exam/Pulses:  Right Left  Radial 2+ (normal) 2+ (normal)  Ulnar 2+ (normal) 2+ (normal)  Femoral 2+ (normal) 2+ (normal)  Popliteal    DP 2+ 2+ (normal)  PT absent 1+ (weak)   Extremities: without ischemic changes, without Gangrene , without cellulitis; with right heal cracked from dry skin Musculoskeletal: no muscle wasting or atrophy  Neurologic: A&O X 3;  No focal weakness or paresthesias are  detected Psychiatric:  The pt has Normal affect.   Non-Invasive Vascular Imaging:     ABI Findings:  +---------+------------------+-----+----------+--------+  Right   Rt Pressure (mmHg)IndexWaveform  Comment   +---------+------------------+-----+----------+--------+  Brachial 163                                        +---------+------------------+-----+----------+--------+  PTA     254               1.56 monophasicbrisk     +---------+------------------+-----+----------+--------+  DP      254               1.56 biphasic            +---------+------------------+-----+----------+--------+  Great Toe93                0.57 Abnormal            +---------+------------------+-----+----------+--------+   +---------+------------------+-----+----------+-------+  Left    Lt Pressure (mmHg)IndexWaveform  Comment  +---------+------------------+-----+----------+-------+  Brachial 161                                       +---------+------------------+-----+----------+-------+  PTA     254               1.56 monophasicbrisk    +---------+------------------+-----+----------+-------+  DP      254               1.56 biphasic           +---------+------------------+-----+----------+-------+  Great Toe92                0.56 Abnormal           +---------+------------------+-----+----------+-------+   ________________________________________________________________________  +-----------+--------+-----+---------------+----------+-----------------+  RIGHT     PSV cm/sRatioStenosis       Waveform  Comments           +-----------+--------+-----+---------------+----------+-----------------+  EIA Distal 120                         triphasic                    +-----------+--------+-----+---------------+----------+-----------------+  CFA Prox   108                         biphasic                      +-----------+--------+-----+---------------+----------+-----------------+  CFA Distal 120                         triphasic                    +-----------+--------+-----+---------------+----------+-----------------+  DFA       81                          triphasic                    +-----------+--------+-----+---------------+----------+-----------------+  SFA Prox   105                         biphasic  stent              +-----------+--------+-----+---------------+----------+-----------------+  SFA Mid                                          stent              +-----------+--------+-----+---------------+----------+-----------------+  SFA Distal 100                         triphasic visualized plaque  +-----------+--------+-----+---------------+----------+-----------------+  POP Prox   65                          biphasic                     +-----------+--------+-----+---------------+----------+-----------------+  POP Mid    52                          biphasic                     +-----------+--------+-----+---------------+----------+-----------------+  POP Distal 234          50-74% stenosis                             +-----------+--------+-----+---------------+----------+-----------------+  TP Trunk   97                          triphasic                    +-----------+--------+-----+---------------+----------+-----------------+  ATA Distal 17                          monophasic                   +-----------+--------+-----+---------------+----------+-----------------+  PTA Distal 30                          monophasic                   +-----------+--------+-----+---------------+----------+-----------------+  PERO Distal18                          monophasic                   +-----------+--------+-----+---------------+----------+-----------------+       Right Stent(s): SFA  +---------------+---++---------++  Prox to Stent  57 biphasic   +---------------+---++---------++  Proximal Stent 81 biphasic   +---------------+---++---------++  Mid Stent      83 triphasic  +---------------+---++---------++  Distal Stent   67 biphasic   +---------------+---++---------++  Distal to Stent100biphasic   +---------------+---++---------++        ASSESSMENT/PLAN: Kaylon Hitz is a 62 y.o. male presenting in follow-up status post 07/26/2021 Right SFA stenting, popliteal artery, posterior tibial artery, peroneal artery, medial plantar artery balloon angioplasty for Rutherford 5 critical limb ischemia.  Most recently he underwent repeat right lower extremity angiography for Rutherford 5 critical limb ischemia.  I was able to open the anterior tibial artery with balloon angioplasty and laser atherectomy however outflow was poor.   On exam today, Daniel Byrd was doing well.  He has a palpable dorsalis pedis pulse in the right foot.  No changes in vascular studies.   Tim stated he would rather dies than lose his leg. Will continue 3 month follow up as he is high risk for limb loss.   He was asked to call my office should any questions or concerns arise as I would be happy to see him earlier.   Victorino Sparrow, MD [facility] [phone]

## 2023-05-24 ENCOUNTER — Encounter: Payer: Self-pay | Admitting: Vascular Surgery

## 2023-05-24 ENCOUNTER — Ambulatory Visit: Payer: Medicare Other | Admitting: Vascular Surgery

## 2023-05-24 ENCOUNTER — Ambulatory Visit (HOSPITAL_COMMUNITY)
Admission: RE | Admit: 2023-05-24 | Discharge: 2023-05-24 | Disposition: A | Discharge status: Home / Self Care | Payer: Medicare Other | Source: Ambulatory Visit | Attending: Vascular Surgery | Admitting: Vascular Surgery

## 2023-05-24 ENCOUNTER — Ambulatory Visit (INDEPENDENT_AMBULATORY_CARE_PROVIDER_SITE_OTHER)
Admission: RE | Admit: 2023-05-24 | Discharge: 2023-05-24 | Disposition: A | Discharge status: Home / Self Care | Payer: Medicare Other | Source: Ambulatory Visit | Attending: Vascular Surgery | Admitting: Vascular Surgery

## 2023-05-24 VITALS — BP 151/92 | HR 89 | Temp 98.1°F | Resp 20 | Ht 69.0 in | Wt 159.0 lb

## 2023-05-24 DIAGNOSIS — I739 Peripheral vascular disease, unspecified: Secondary | ICD-10-CM

## 2023-05-24 DIAGNOSIS — I70221 Atherosclerosis of native arteries of extremities with rest pain, right leg: Secondary | ICD-10-CM

## 2023-05-24 DIAGNOSIS — Z95828 Presence of other vascular implants and grafts: Secondary | ICD-10-CM | POA: Diagnosis not present

## 2023-05-24 DIAGNOSIS — I70211 Atherosclerosis of native arteries of extremities with intermittent claudication, right leg: Secondary | ICD-10-CM | POA: Diagnosis not present

## 2023-05-24 LAB — VAS US ABI WITH/WO TBI

## 2023-05-29 ENCOUNTER — Other Ambulatory Visit: Payer: Self-pay

## 2023-05-29 DIAGNOSIS — I70211 Atherosclerosis of native arteries of extremities with intermittent claudication, right leg: Secondary | ICD-10-CM

## 2023-05-29 DIAGNOSIS — I70221 Atherosclerosis of native arteries of extremities with rest pain, right leg: Secondary | ICD-10-CM

## 2023-06-05 DIAGNOSIS — E1165 Type 2 diabetes mellitus with hyperglycemia: Secondary | ICD-10-CM | POA: Diagnosis not present

## 2023-06-05 DIAGNOSIS — K219 Gastro-esophageal reflux disease without esophagitis: Secondary | ICD-10-CM | POA: Diagnosis not present

## 2023-06-05 DIAGNOSIS — E785 Hyperlipidemia, unspecified: Secondary | ICD-10-CM | POA: Diagnosis not present

## 2023-06-05 DIAGNOSIS — I10 Essential (primary) hypertension: Secondary | ICD-10-CM | POA: Diagnosis not present

## 2023-06-05 DIAGNOSIS — I739 Peripheral vascular disease, unspecified: Secondary | ICD-10-CM | POA: Diagnosis not present

## 2023-06-05 DIAGNOSIS — Z6822 Body mass index (BMI) 22.0-22.9, adult: Secondary | ICD-10-CM | POA: Diagnosis not present

## 2023-06-05 DIAGNOSIS — I890 Lymphedema, not elsewhere classified: Secondary | ICD-10-CM | POA: Diagnosis not present

## 2023-08-15 ENCOUNTER — Encounter: Payer: Self-pay | Admitting: Vascular Surgery

## 2023-08-15 ENCOUNTER — Ambulatory Visit (INDEPENDENT_AMBULATORY_CARE_PROVIDER_SITE_OTHER)
Admission: RE | Admit: 2023-08-15 | Discharge: 2023-08-15 | Disposition: A | Discharge status: Home / Self Care | Payer: Medicare Other | Source: Ambulatory Visit | Attending: Vascular Surgery | Admitting: Vascular Surgery

## 2023-08-15 ENCOUNTER — Ambulatory Visit (HOSPITAL_COMMUNITY)
Admission: RE | Admit: 2023-08-15 | Discharge: 2023-08-15 | Disposition: A | Discharge status: Home / Self Care | Payer: Medicare Other | Source: Ambulatory Visit | Attending: Vascular Surgery | Admitting: Vascular Surgery

## 2023-08-15 ENCOUNTER — Ambulatory Visit: Payer: Medicare Other | Admitting: Vascular Surgery

## 2023-08-15 ENCOUNTER — Ambulatory Visit (HOSPITAL_COMMUNITY): Payer: Medicare Other

## 2023-08-15 VITALS — BP 134/81 | HR 70 | Temp 98.2°F | Ht 69.0 in | Wt 158.8 lb

## 2023-08-15 DIAGNOSIS — I70221 Atherosclerosis of native arteries of extremities with rest pain, right leg: Secondary | ICD-10-CM

## 2023-08-15 DIAGNOSIS — I70211 Atherosclerosis of native arteries of extremities with intermittent claudication, right leg: Secondary | ICD-10-CM

## 2023-08-15 DIAGNOSIS — Z95828 Presence of other vascular implants and grafts: Secondary | ICD-10-CM | POA: Diagnosis not present

## 2023-08-15 LAB — VAS US ABI WITH/WO TBI

## 2023-08-15 NOTE — Progress Notes (Signed)
Office Note    HPI: Valgene Deloatch is a 62 y.o. (29-Nov-1960) male presenting in follow-up status post 07/26/2021 Right SFA stenting, popliteal artery, posterior tibial artery, peroneal artery, medial plantar artery balloon angioplasty for Rutherford 5 critical limb ischemia.Mostly recently Tim underwent repeat angiogram for RLE rutherfird 4 CLI demonstrating no outflow to the foot.   On exam today, Eliya was doing well.  His wife's sister, Waynetta Sandy, who usually comes with him, was unavailable as she recently had knee surgery.  Since his last visit, [name] has been doing well.  He continues to walk several blocks a day, and mowed both his yard and his neighbors yard with a push mower yesterday without issue. He has cut out sugar from his diet.  No wounds. Mild claudication, no rest pain.  The pt is  on a statin for cholesterol management.  The pt is  on a daily aspirin.   Other AC:  Xarelto The pt is is on medication for hypertension.   The pt is  diabetic.  Tobacco hx:  none  Past Medical History:  Diagnosis Date   Abscess    Diabetes mellitus without complication [facility])     Past Surgical History:  Procedure Laterality Date   ABDOMINAL AORTOGRAM W/LOWER EXTREMITY N/A 07/26/2021   Procedure: ABDOMINAL AORTOGRAM W/ Bilateral LOWER EXTREMITY Runoff;  Surgeon: Victorino Sparrow, MD;  Location: [facility] INVASIVE CV LAB;  Service: Cardiovascular;  Laterality: N/A;   ABDOMINAL AORTOGRAM W/LOWER EXTREMITY N/A 02/21/2022   Procedure: ABDOMINAL AORTOGRAM W/LOWER EXTREMITY;  Surgeon: Victorino Sparrow, MD;  Location: [facility] INVASIVE CV LAB;  Service: Cardiovascular;  Laterality: N/A;   PERIPHERAL VASCULAR ATHERECTOMY Left 02/21/2022   Procedure: PERIPHERAL VASCULAR ATHERECTOMY;  Surgeon: Victorino Sparrow, MD;  Location: [facility] INVASIVE CV LAB;  Service: Cardiovascular;  Laterality: Left;  tibial   PERIPHERAL VASCULAR BALLOON ANGIOPLASTY Right 07/26/2021   Procedure: PERIPHERAL VASCULAR BALLOON ANGIOPLASTY;  Surgeon: Victorino Sparrow, MD;  Location: [facility] INVASIVE CV LAB;  Service: Cardiovascular;  Laterality: Right;  Posterior tibial   PERIPHERAL VASCULAR BALLOON ANGIOPLASTY Left 02/21/2022   Procedure: PERIPHERAL VASCULAR BALLOON ANGIOPLASTY;  Surgeon: Victorino Sparrow, MD;  Location: [facility] INVASIVE CV LAB;  Service: Cardiovascular;  Laterality: Left;  SFa and popliteal   PERIPHERAL VASCULAR INTERVENTION Right 07/26/2021   Procedure: PERIPHERAL VASCULAR INTERVENTION;  Surgeon: Victorino Sparrow, MD;  Location: [facility] INVASIVE CV LAB;  Service: Cardiovascular;  Laterality: Right;  Superficial femoral    Social History   Socioeconomic History   Marital status: Widowed    Spouse name: Not on file   Number of children: Not on file   Years of education: Not on file   Highest education level: Not on file  Occupational History   Not on file  Tobacco Use   Smoking status: Never   Smokeless tobacco: Never  Vaping Use   Vaping status: Never Used  Substance and Sexual Activity   Alcohol use: No   Drug use: Yes    Types: Marijuana   Sexual activity: Not on file  Other Topics Concern   Not on file  Social History Narrative   Not on file   Social Determinants of Health   Financial Resource Strain: Low Risk  (09/06/2021)   Received from [facility], [facility]   Overall Financial Resource Strain (CARDIA)    Difficulty of Paying Living Expenses: Not hard at all  Food Insecurity: No Food Insecurity (09/06/2021)   Received from [facility], [facility]   Hunger Vital Sign    Worried About Running Out of Food in the Last Year: Never true    Ran Out of Food in the Last Year: Never true  Transportation Needs: No Transportation Needs (09/06/2021)   Received from [facility], [facility]   [facility] - Transportation    Lack of Transportation (Medical): No    Lack of Transportation (Non-Medical): No  Physical Activity: Inactive (09/06/2021)   Received [facility]   Exercise  Vital Sign    Days of Exercise per Week: 0 days    Minutes of Exercise per Session: 0 min  Stress: No Stress Concern Present (09/06/2021)   Received from [facility], [facility] of Occupational Health - Occupational Stress Questionnaire    Feeling of Stress : Not at all  Social Connections: Moderately Isolated (09/06/2021)   Received from [facility], [facility]   Social Connection and Isolation Panel [NHANES]    Frequency of Communication with Friends and Family: More than three times a week    Frequency of Social Gatherings with Friends and Family: Three times a week    Attends Religious Services: More than 4 times per year    Active Member of Clubs or Organizations: No    Attends Banker Meetings: Never    Marital Status: Widowed  Intimate Partner Violence: Not At Risk (09/06/2021)   Received from [facility], [facility]   Humiliation, Afraid, Rape, and Kick questionnaire    Fear of Current or Ex-Partner: No    Emotionally Abused: No    Physically Abused: No    Sexually Abused: No    Family History  Problem Relation Age of Onset   Stroke Mother    Diabetes Mother    Heart failure Father     Current Outpatient Medications  Medication Sig Dispense Refill   ALPRAZolam (XANAX) 0.5 MG tablet Take 0.5 mg by mouth 2 (two) times daily.     aspirin 81 MG EC tablet Take 1 tablet (81 mg total) by mouth daily. Swallow whole. 30 tablet 11   atorvastatin (LIPITOR) 80 MG tablet Take 80 mg by mouth daily.     JARDIANCE 25 MG TABS tablet Take 25 mg by mouth daily.     lisinopril (ZESTRIL) 10 MG tablet Take 10 mg by mouth daily.     Menthol, Topical Analgesic, (BLUE-EMU MAXIMUM STRENGTH) 2.5 % LIQD Apply 1 application. topically daily as needed (pain).     naproxen sodium (ALEVE) 220 MG tablet Take 220 mg by mouth daily as needed (pain).     omeprazole (PRILOSEC) 20 MG capsule Take 20 mg by mouth daily as needed (indigestion).      XARELTO 20 MG TABS tablet Take 1 tablet (20 mg total) by mouth daily with supper. Restart tomorrow 30 tablet    No current facility-administered medications for this visit.    No Known Allergies   REVIEW OF SYSTEMS:   [X]  denotes positive finding, [ ]  denotes negative finding Cardiac  Comments:  Chest pain or chest pressure:    Shortness of breath upon exertion:    Short of breath when lying flat:    Irregular heart rhythm:        Vascular    Pain in calf, thigh, or hip brought on by ambulation:    Pain in feet at night that wakes you up from your sleep:  Blood clot in your veins:    Leg swelling:         Pulmonary    Oxygen at home:    Productive cough:     Wheezing:         Neurologic    Sudden weakness in arms or legs:     Sudden numbness in arms or legs:     Sudden onset of difficulty speaking or slurred speech:    Temporary loss of vision in one eye:     Problems with dizziness:         Gastrointestinal    Blood in stool:     Vomited blood:         Genitourinary    Burning when urinating:     Blood in urine:        Psychiatric    Major depression:         Hematologic    Bleeding problems:    Problems with blood clotting too easily:        Skin    Rashes or ulcers:        Constitutional    Fever or chills:      PHYSICAL EXAMINATION:  There were no vitals filed for this visit.    General:  WDWN in NAD; vital signs documented above Gait: Not observed HENT: WNL, normocephalic Pulmonary: normal non-labored breathing , without Rales, rhonchi,  wheezing Cardiac: regular HR Abdomen: soft, NT, no masses Skin: without rashes Vascular Exam/Pulses:  Right Left  Radial 2+ (normal) 2+ (normal)  Ulnar 2+ (normal) 2+ (normal)  Femoral 2+ (normal) 2+ (normal)  Popliteal    DP 2+ 2+ (normal)  PT absent 1+ (weak)   Extremities: without ischemic changes, without Gangrene , without cellulitis; with right heal cracked from dry skin Musculoskeletal: no  muscle wasting or atrophy  Neurologic: A&O X 3;  No focal weakness or paresthesias are detected Psychiatric:  The pt has Normal affect.   Non-Invasive Vascular Imaging:     +-------+-----------+-----------+------------+------------+  ABI/TBIToday's ABIToday's TBIPrevious ABIPrevious TBI  +-------+-----------+-----------+------------+------------+  Right [place]         0.65       [place]          0.57          +-------+-----------+-----------+------------+------------+  Left  [place]         0.77       [place]          0.56          +-------+-----------+-----------+------------+------------+    +-----------+--------+-----+---------------+----------+--------+  RIGHT     PSV cm/sRatioStenosis       Waveform  Comments  +-----------+--------+-----+---------------+----------+--------+  CFA Mid    98                          triphasic           +-----------+--------+-----+---------------+----------+--------+  DFA       109                         triphasic           +-----------+--------+-----+---------------+----------+--------+  SFA Prox   113                         biphasic            +-----------+--------+-----+---------------+----------+--------+  POP Prox   43  biphasic            +-----------+--------+-----+---------------+----------+--------+  POP Distal 220          50-74% stenosistriphasic           +-----------+--------+-----+---------------+----------+--------+  ATA Distal 40                          biphasic            +-----------+--------+-----+---------------+----------+--------+  PTA Distal 25                          monophasic          +-----------+--------+-----+---------------+----------+--------+  PERO Distal21                          monophasic          +-----------+--------+-----+---------------+----------+--------+       Right Stent(s):   +---------------+--------+--------+---------+--------+  SFA           PSV cm/sStenosisWaveform Comments  +---------------+--------+--------+---------+--------+  Prox to Stent  61              biphasic           +---------------+--------+--------+---------+--------+  Proximal Stent 69              biphasic           +---------------+--------+--------+---------+--------+  Mid Stent      72              biphasic           +---------------+--------+--------+---------+--------+  Distal Stent   58              biphasic           +---------------+--------+--------+---------+--------+  Distal to Stent85              triphasic          +---------------+--------+--------+---------+--------+     ASSESSMENT/PLAN: Talal Fritchman is a 62 y.o. male presenting in follow-up status post 07/26/2021 Right SFA stenting, popliteal artery, posterior tibial artery, peroneal artery, medial plantar artery balloon angioplasty for Rutherford 5 critical limb ischemia.  Most recently he underwent repeat right lower extremity angiography for Rutherford 5 critical limb ischemia.  I was able to open the anterior tibial artery with balloon angioplasty and laser atherectomy however outflow was poor.   On exam today, [name] was doing well.  He has a palpable dorsalis pedis pulse in the right foot.  No changes in vascular studies.   Tim stated he would rather dies than lose his leg. Will continue 6 month follow up as he is high risk for limb loss.   He was asked to call my office should any questions or concerns arise as I would be happy to see him earlier.   Victorino Sparrow, MD [facility] [phone]

## 2023-08-16 ENCOUNTER — Encounter (HOSPITAL_COMMUNITY): Payer: Medicare Other

## 2023-08-16 ENCOUNTER — Ambulatory Visit: Payer: Medicare Other | Admitting: Vascular Surgery

## 2023-09-03 DIAGNOSIS — I890 Lymphedema, not elsewhere classified: Secondary | ICD-10-CM | POA: Diagnosis not present

## 2023-09-03 DIAGNOSIS — E1165 Type 2 diabetes mellitus with hyperglycemia: Secondary | ICD-10-CM | POA: Diagnosis not present

## 2023-09-03 DIAGNOSIS — E559 Vitamin D deficiency, unspecified: Secondary | ICD-10-CM | POA: Diagnosis not present

## 2023-09-03 DIAGNOSIS — E785 Hyperlipidemia, unspecified: Secondary | ICD-10-CM | POA: Diagnosis not present

## 2023-09-03 DIAGNOSIS — I739 Peripheral vascular disease, unspecified: Secondary | ICD-10-CM | POA: Diagnosis not present

## 2023-09-03 DIAGNOSIS — K219 Gastro-esophageal reflux disease without esophagitis: Secondary | ICD-10-CM | POA: Diagnosis not present

## 2023-09-03 DIAGNOSIS — I10 Essential (primary) hypertension: Secondary | ICD-10-CM | POA: Diagnosis not present

## 2023-09-03 DIAGNOSIS — Z6822 Body mass index (BMI) 22.0-22.9, adult: Secondary | ICD-10-CM | POA: Diagnosis not present

## 2023-09-10 ENCOUNTER — Other Ambulatory Visit: Payer: Self-pay

## 2023-09-10 DIAGNOSIS — I70221 Atherosclerosis of native arteries of extremities with rest pain, right leg: Secondary | ICD-10-CM

## 2023-10-07 DIAGNOSIS — Z0001 Encounter for general adult medical examination with abnormal findings: Secondary | ICD-10-CM | POA: Diagnosis not present

## 2023-11-15 ENCOUNTER — Other Ambulatory Visit (HOSPITAL_COMMUNITY): Payer: Self-pay

## 2024-01-08 DIAGNOSIS — I10 Essential (primary) hypertension: Secondary | ICD-10-CM | POA: Diagnosis not present

## 2024-01-08 DIAGNOSIS — E1121 Type 2 diabetes mellitus with diabetic nephropathy: Secondary | ICD-10-CM | POA: Diagnosis not present

## 2024-01-08 DIAGNOSIS — E785 Hyperlipidemia, unspecified: Secondary | ICD-10-CM | POA: Diagnosis not present

## 2024-01-08 DIAGNOSIS — Z6823 Body mass index (BMI) 23.0-23.9, adult: Secondary | ICD-10-CM | POA: Diagnosis not present

## 2024-01-08 DIAGNOSIS — I739 Peripheral vascular disease, unspecified: Secondary | ICD-10-CM | POA: Diagnosis not present

## 2024-02-12 NOTE — Progress Notes (Unsigned)
 Office Note    HPI: Daniel Byrd is a 63 y.o. (24-Sep-1961) male presenting in follow-up status post 07/26/2021 Right SFA stenting, popliteal artery, posterior tibial artery, peroneal artery, medial plantar artery balloon angioplasty for Rutherford 5 critical limb ischemia.with subsequent reintervention for Rutherford 4 CLI in 2023.  He has done well since that time without further intervention.  On exam today, Daniel Byrd was doing well.  His wife's sister, Daniel Byrd, who usually comes with him was unavailable due to a work conflict.  Since his last visit, Daniel Byrd has been doing well.  He continues to walk several blocks a day, and mowed both his yard and his neighbors yard with a push mower yesterday without issue.  He continues to eat a healthy diet, and has cut out sugar. No wounds. Mild claudication, no rest pain.  The pt is  on a statin for cholesterol management.  The pt is  on a daily aspirin.   Other AC:  Xarelto The pt is is on medication for hypertension.   The pt is  diabetic.  Tobacco hx:  none  Past Medical History:  Diagnosis Date   Abscess    Diabetes mellitus without complication (HCC)    Hyperlipidemia    Hypertension    Peripheral arterial disease (HCC)     Past Surgical History:  Procedure Laterality Date   ABDOMINAL AORTOGRAM W/LOWER EXTREMITY N/A 07/26/2021   Procedure: ABDOMINAL AORTOGRAM W/ Bilateral LOWER EXTREMITY Runoff;  Surgeon: Victorino Sparrow, MD;  Location: [facility] INVASIVE CV LAB;  Service: Cardiovascular;  Laterality: N/A;   ABDOMINAL AORTOGRAM W/LOWER EXTREMITY N/A 02/21/2022   Procedure: ABDOMINAL AORTOGRAM W/LOWER EXTREMITY;  Surgeon: Victorino Sparrow, MD;  Location: [facility] INVASIVE CV LAB;  Service: Cardiovascular;  Laterality: N/A;   PERIPHERAL VASCULAR ATHERECTOMY Left 02/21/2022   Procedure: PERIPHERAL VASCULAR ATHERECTOMY;  Surgeon: Victorino Sparrow, MD;  Location: [facility] INVASIVE CV LAB;  Service: Cardiovascular;  Laterality: Left;  tibial   PERIPHERAL VASCULAR BALLOON  ANGIOPLASTY Right 07/26/2021   Procedure: PERIPHERAL VASCULAR BALLOON ANGIOPLASTY;  Surgeon: Victorino Sparrow, MD;  Location: [facility] INVASIVE CV LAB;  Service: Cardiovascular;  Laterality: Right;  Posterior tibial   PERIPHERAL VASCULAR BALLOON ANGIOPLASTY Left 02/21/2022   Procedure: PERIPHERAL VASCULAR BALLOON ANGIOPLASTY;  Surgeon: Victorino Sparrow, MD;  Location: [facility] INVASIVE CV LAB;  Service: Cardiovascular;  Laterality: Left;  SFa and popliteal   PERIPHERAL VASCULAR INTERVENTION Right 07/26/2021   Procedure: PERIPHERAL VASCULAR INTERVENTION;  Surgeon: Victorino Sparrow, MD;  Location: [facility] INVASIVE CV LAB;  Service: Cardiovascular;  Laterality: Right;  Superficial femoral    Social History   Socioeconomic History   Marital status: Widowed    Spouse name: Not on file   Number of children: Not on file   Years of education: Not on file   Highest education level: Not on file  Occupational History   Not on file  Tobacco Use   Smoking status: Never   Smokeless tobacco: Never  Vaping Use   Vaping status: Never Used  Substance and Sexual Activity   Alcohol use: No   Drug use: Yes    Types: Marijuana   Sexual activity: Not on file  Other Topics Concern   Not on file  Social History Narrative   Not on file   Social Drivers of Health   Financial Resource Strain: Low Risk  (09/06/2021)   Received [facility], [facility]   Overall Financial Resource Strain (CARDIA)    Difficulty of Paying Living  Expenses: Not hard at all  Food Insecurity: No Food Insecurity (09/06/2021)   Received from [facility], [facility]   Hunger Vital Sign    Worried About Running Out of Food in the Last Year: Never true    Ran Out of Food in the Last Year: Never true  Transportation Needs: No Transportation Needs (09/06/2021)   Received from [facility], [facility]   [facility] - Transportation    Lack of Transportation (Medical): No    Lack of Transportation (Non-Medical): No  Physical  Activity: Inactive (09/06/2021)   Received from [facility], [facility]   Exercise Vital Sign    Days of Exercise per Week: 0 days    Minutes of Exercise per Session: 0 min  Stress: No Stress Concern Present (09/06/2021)   Received from [facility], [facility] of Occupational Health - Occupational Stress Questionnaire    Feeling of Stress : Not at all  Social Connections: Moderately Isolated (09/06/2021)   Received from [facility], [facility]   Social Connection and Isolation Panel [NHANES]    Frequency of Communication with Friends and Family: More than three times a week    Frequency of Social Gatherings with Friends and Family: Three times a week    Attends Religious Services: More than 4 times per year    Active Member of Clubs or Organizations: No    Attends Banker Meetings: Never    Marital Status: Widowed  Intimate Partner Violence: Not At Risk (09/06/2021)   Received from [facility], [facility]   Humiliation, Afraid, Rape, and Kick questionnaire    Fear of Current or Ex-Partner: No    Emotionally Abused: No    Physically Abused: No    Sexually Abused: No    Family History  Problem Relation Age of Onset   Stroke Mother    Diabetes Mother    Heart failure Father     Current Outpatient Medications  Medication Sig Dispense Refill   ALPRAZolam (XANAX) 0.5 MG tablet Take 0.5 mg by mouth 2 (two) times daily.     aspirin 81 MG EC tablet Take 1 tablet (81 mg total) by mouth daily. Swallow whole. 30 tablet 11   atorvastatin (LIPITOR) 80 MG tablet Take 80 mg by mouth daily.     JARDIANCE 25 MG TABS tablet Take 25 mg by mouth daily.     lisinopril (ZESTRIL) 10 MG tablet Take 10 mg by mouth daily.     Menthol, Topical Analgesic, (BLUE-EMU MAXIMUM STRENGTH) 2.5 % LIQD Apply 1 application. topically daily as needed (pain).     naproxen sodium (ALEVE) 220 MG tablet Take 220 mg by mouth daily as needed (pain).  (Patient not taking: Reported on 08/15/2023)     omeprazole (PRILOSEC) 20 MG capsule Take 20 mg by mouth daily as needed (indigestion).     XARELTO 20 MG TABS tablet Take 1 tablet (20 mg total) by mouth daily with supper. Restart tomorrow 30 tablet    No current facility-administered medications for this visit.    No Known Allergies   REVIEW OF SYSTEMS:   [X]  denotes positive finding, [ ]  denotes negative finding Cardiac  Comments:  Chest pain or chest pressure:    Shortness of breath upon exertion:    Short of breath when lying flat:    Irregular heart rhythm:        Vascular  Pain in calf, thigh, or hip brought on by ambulation:    Pain in feet at night that wakes you up from your sleep:     Blood clot in your veins:    Leg swelling:         Pulmonary    Oxygen at home:    Productive cough:     Wheezing:         Neurologic    Sudden weakness in arms or legs:     Sudden numbness in arms or legs:     Sudden onset of difficulty speaking or slurred speech:    Temporary loss of vision in one eye:     Problems with dizziness:         Gastrointestinal    Blood in stool:     Vomited blood:         Genitourinary    Burning when urinating:     Blood in urine:        Psychiatric    Major depression:         Hematologic    Bleeding problems:    Problems with blood clotting too easily:        Skin    Rashes or ulcers:        Constitutional    Fever or chills:      PHYSICAL EXAMINATION:  There were no vitals filed for this visit.  General:  WDWN in NAD; vital signs documented above Gait: Not observed HENT: WNL, normocephalic Pulmonary: normal non-labored breathing , without Rales, rhonchi,  wheezing Cardiac: regular HR Abdomen: soft, NT, no masses Skin: without rashes Vascular Exam/Pulses:  Right Left  Radial 2+ (normal) 2+ (normal)  Ulnar 2+ (normal) 2+ (normal)  Femoral 2+ (normal) 2+ (normal)  Popliteal    DP 2+ 2+ (normal)  PT absent 1+ (weak)    Extremities: without ischemic changes, without Gangrene , without cellulitis; with right heal cracked from dry skin Musculoskeletal: no muscle wasting or atrophy  Neurologic: A&O X 3;  No focal weakness or paresthesias are detected Psychiatric:  The pt has Normal affect.   Non-Invasive Vascular Imaging:     +-------+-----------+-----------+------------+------------+  ABI/TBIToday's ABIToday's TBIPrevious ABIPrevious TBI  +-------+-----------+-----------+------------+------------+  Right [place]         0.61       [place]          0.65          +-------+-----------+-----------+------------+------------+  Left  [place]         0.42       [place]          0.77          +-------+-----------+-----------+------------+------------+   +-----------+--------+-----+---------------+---------+--------+  RIGHT     PSV cm/sRatioStenosis       Waveform Comments  +-----------+--------+-----+---------------+---------+--------+  CFA Prox   150                         biphasic           +-----------+--------+-----+---------------+---------+--------+  CFA Distal 98                          triphasic          +-----------+--------+-----+---------------+---------+--------+  DFA       109                         triphasic          +-----------+--------+-----+---------------+---------+--------+  SFA Distal 103                         triphasic          +-----------+--------+-----+---------------+---------+--------+  POP Prox   80                          triphasic          +-----------+--------+-----+---------------+---------+--------+  POP Distal 256          50-74% stenosistriphasic          +-----------+--------+-----+---------------+---------+--------+  TP Trunk   113                         triphasic          +-----------+--------+-----+---------------+---------+--------+  ATA Distal 34                          biphasic            +-----------+--------+-----+---------------+---------+--------+  PTA Distal                                      NV        +-----------+--------+-----+---------------+---------+--------+  PERO Distal63                          biphasic           +-----------+--------+-----+---------------+---------+--------+       Right Stent(s):  +---------------+--------+--------+---------+--------+  SFA           PSV cm/sStenosisWaveform Comments  +---------------+--------+--------+---------+--------+  Prox to Stent  72              triphasic          +---------------+--------+--------+---------+--------+  Proximal Stent 81              triphasic          +---------------+--------+--------+---------+--------+  Mid Stent      107             triphasic          +---------------+--------+--------+---------+--------+  Distal Stent   82              triphasic          +---------------+--------+--------+---------+--------+  Distal to Stent91              triphasic          +---------------+--------+--------+---------+--------+    ASSESSMENT/PLAN: Jemel Ono is a 63 y.o. male presenting in follow-up status post 07/26/2021 Right SFA stenting, popliteal artery, posterior tibial artery, peroneal artery, medial plantar artery balloon angioplasty for Rutherford 5 critical limb ischemia.  Most recently he underwent repeat right lower extremity angiography for Rutherford 5 critical limb ischemia 2023.  I was able to open the anterior tibial artery with balloon angioplasty and laser atherectomy however outflow was poor.   Since 2023, Daniel Byrd has continued to do well.  He stopped smoking, and walks on a regular basis.  He push mows his yard without issue.  On exam today, Daniel Byrd was doing well.  He has a palpable dorsalis pedis pulse in the right foot.  No changes in vascular studies.   Daniel Byrd stated he would rather dies than lose his leg. Will continue 6 month follow up  as he is high risk for limb loss.   He was asked to call my office should any questions or concerns arise as I would be happy to see him earlier.   Kayla Part, MD [facility] [phone]

## 2024-02-13 ENCOUNTER — Ambulatory Visit (HOSPITAL_COMMUNITY)
Admission: RE | Admit: 2024-02-13 | Discharge: 2024-02-13 | Disposition: A | Discharge status: Home / Self Care | Payer: Medicare Other | Source: Ambulatory Visit | Attending: Vascular Surgery | Admitting: Vascular Surgery

## 2024-02-13 ENCOUNTER — Encounter: Payer: Self-pay | Admitting: Vascular Surgery

## 2024-02-13 ENCOUNTER — Ambulatory Visit: Payer: Medicare Other | Admitting: Vascular Surgery

## 2024-02-13 VITALS — BP 134/82 | HR 68 | Temp 98.4°F | Resp 18 | Ht 69.0 in | Wt 161.5 lb

## 2024-02-13 DIAGNOSIS — Z95828 Presence of other vascular implants and grafts: Secondary | ICD-10-CM | POA: Diagnosis not present

## 2024-02-13 DIAGNOSIS — I70221 Atherosclerosis of native arteries of extremities with rest pain, right leg: Secondary | ICD-10-CM

## 2024-02-13 DIAGNOSIS — I739 Peripheral vascular disease, unspecified: Secondary | ICD-10-CM | POA: Diagnosis not present

## 2024-02-13 LAB — VAS US ABI WITH/WO TBI

## 2024-02-27 ENCOUNTER — Other Ambulatory Visit: Payer: Self-pay | Admitting: *Deleted

## 2024-02-27 DIAGNOSIS — I739 Peripheral vascular disease, unspecified: Secondary | ICD-10-CM

## 2024-02-27 DIAGNOSIS — I70221 Atherosclerosis of native arteries of extremities with rest pain, right leg: Secondary | ICD-10-CM

## 2024-06-24 DIAGNOSIS — I10 Essential (primary) hypertension: Secondary | ICD-10-CM | POA: Diagnosis not present

## 2024-06-24 DIAGNOSIS — E039 Hypothyroidism, unspecified: Secondary | ICD-10-CM | POA: Diagnosis not present

## 2024-06-24 DIAGNOSIS — E1121 Type 2 diabetes mellitus with diabetic nephropathy: Secondary | ICD-10-CM | POA: Diagnosis not present

## 2024-06-24 DIAGNOSIS — E559 Vitamin D deficiency, unspecified: Secondary | ICD-10-CM | POA: Diagnosis not present

## 2024-06-24 DIAGNOSIS — E785 Hyperlipidemia, unspecified: Secondary | ICD-10-CM | POA: Diagnosis not present

## 2024-06-24 DIAGNOSIS — I739 Peripheral vascular disease, unspecified: Secondary | ICD-10-CM | POA: Diagnosis not present

## 2024-08-06 DIAGNOSIS — R5383 Other fatigue: Secondary | ICD-10-CM | POA: Diagnosis not present

## 2024-08-06 DIAGNOSIS — E039 Hypothyroidism, unspecified: Secondary | ICD-10-CM | POA: Diagnosis not present

## 2024-08-06 DIAGNOSIS — D559 Anemia due to enzyme disorder, unspecified: Secondary | ICD-10-CM | POA: Diagnosis not present

## 2024-08-20 ENCOUNTER — Encounter: Payer: Self-pay | Admitting: Vascular Surgery

## 2024-08-20 ENCOUNTER — Ambulatory Visit (HOSPITAL_COMMUNITY)
Admission: RE | Admit: 2024-08-20 | Discharge: 2024-08-20 | Disposition: A | Discharge status: Home / Self Care | Source: Ambulatory Visit | Attending: Vascular Surgery | Admitting: Vascular Surgery

## 2024-08-20 ENCOUNTER — Ambulatory Visit (HOSPITAL_COMMUNITY): Admission: RE | Admit: 2024-08-20 | Discharge: 2024-08-20 | Attending: Vascular Surgery

## 2024-08-20 ENCOUNTER — Ambulatory Visit: Admitting: Vascular Surgery

## 2024-08-20 VITALS — BP 154/87 | HR 62 | Temp 97.9°F | Resp 18 | Ht 69.0 in | Wt 180.3 lb

## 2024-08-20 DIAGNOSIS — Z95828 Presence of other vascular implants and grafts: Secondary | ICD-10-CM | POA: Diagnosis not present

## 2024-08-20 DIAGNOSIS — I70221 Atherosclerosis of native arteries of extremities with rest pain, right leg: Secondary | ICD-10-CM

## 2024-08-20 DIAGNOSIS — I739 Peripheral vascular disease, unspecified: Secondary | ICD-10-CM

## 2024-08-20 DIAGNOSIS — I70211 Atherosclerosis of native arteries of extremities with intermittent claudication, right leg: Secondary | ICD-10-CM | POA: Diagnosis not present

## 2024-08-20 LAB — VAS US ABI WITH/WO TBI

## 2024-08-20 NOTE — Progress Notes (Signed)
 Office Note    HPI: Jaamal Farooqui is a 63 y.o. ([birth year]) male presenting in follow-up status post 07/26/2021 Right SFA stenting, popliteal artery, posterior tibial artery, peroneal artery, medial plantar artery balloon angioplasty for Rutherford 5 critical limb ischemia.with subsequent reintervention for Rutherford 4 CLI in 2023.  He has done well since that time without further intervention.  On exam today, Ashyr was doing well.  He continues to walk several blocks a day, and mowed both his yard and his neighbors yard with a push mower yesterday without issue. He is always worries he is going to get bad news. He continues to eat a healthy diet, and has cut out sugar. No wounds. Mild claudication, no rest pain.  The pt is  on a statin for cholesterol management.  The pt is  on a daily aspirin .   Other AC:  Xarelto  The pt is is on medication for hypertension.   The pt is  diabetic.  Tobacco hx:  none  Past Medical History:  Diagnosis Date   Abscess    Diabetes mellitus without complication (HCC)    Hyperlipidemia    Hypertension    Peripheral arterial disease     Past Surgical History:  Procedure Laterality Date   ABDOMINAL AORTOGRAM W/LOWER EXTREMITY N/A 07/26/2021   Procedure: ABDOMINAL AORTOGRAM W/ Bilateral LOWER EXTREMITY Run[place];  Surgeon: Lanis Fonda BRAVO, MD;  Location: [facility] INVASIVE CV LAB;  Service: Cardiovascular;  Laterality: N/A;   ABDOMINAL AORTOGRAM W/LOWER EXTREMITY N/A 02/21/2022   Procedure: ABDOMINAL AORTOGRAM W/LOWER EXTREMITY;  Surgeon: Lanis Fonda BRAVO, MD;  Location: [facility] INVASIVE CV LAB;  Service: Cardiovascular;  Laterality: N/A;   PERIPHERAL VASCULAR ATHERECTOMY Left 02/21/2022   Procedure: PERIPHERAL VASCULAR ATHERECTOMY;  Surgeon: Lanis Fonda BRAVO, MD;  Location: [facility] INVASIVE CV LAB;  Service: Cardiovascular;  Laterality: Left;  tibial   PERIPHERAL VASCULAR BALLOON ANGIOPLASTY Right 07/26/2021   Procedure: PERIPHERAL VASCULAR BALLOON ANGIOPLASTY;  Surgeon:  Lanis Fonda BRAVO, MD;  Location: [facility] INVASIVE CV LAB;  Service: Cardiovascular;  Laterality: Right;  Posterior tibial   PERIPHERAL VASCULAR BALLOON ANGIOPLASTY Left 02/21/2022   Procedure: PERIPHERAL VASCULAR BALLOON ANGIOPLASTY;  Surgeon: Lanis Fonda BRAVO, MD;  Location: [facility] INVASIVE CV LAB;  Service: Cardiovascular;  Laterality: Left;  SFa and popliteal   PERIPHERAL VASCULAR INTERVENTION Right 07/26/2021   Procedure: PERIPHERAL VASCULAR INTERVENTION;  Surgeon: Lanis Fonda BRAVO, MD;  Location: [facility] INVASIVE CV LAB;  Service: Cardiovascular;  Laterality: Right;  Superficial femoral    Social History   Socioeconomic History   Marital status: Widowed    Spouse name: Not on file   Number of children: Not on file   Years of education: Not on file   Highest education level: Not on file  Occupational History   Not on file  Tobacco Use   Smoking status: Never   Smokeless tobacco: Never  Vaping Use   Vaping status: Never Used  Substance and Sexual Activity   Alcohol use: No   Drug use: Yes    Types: Marijuana   Sexual activity: Not on file  Other Topics Concern   Not on file  Social History Narrative   Not on file   Social Drivers of Health   Financial Resource Strain: Low Risk  (09/06/2021)   Received from [facility]   Overall Financial Resource Strain (CARDIA)    Difficulty of Paying Living Expenses: Not hard at all  Food Insecurity: No Food Insecurity (09/06/2021)   Received from [facility]  Hunger Vital Sign    Within the past 12 months, you worried that your food would run out before you got the money to buy more.: Never true    Within the past 12 months, the food you bought just didn't last and you didn't have money to get more.: Never true  Transportation Needs: No Transportation Needs (09/06/2021)   Received from [facility] - Transportation    Lack of Transportation (Medical): No    Lack of Transportation (Non-Medical): No  Physical Activity: Inactive  (09/06/2021)   Received from [facility]   Exercise Vital Sign    On average, how many days per week do you engage in moderate to strenuous exercise (like a brisk walk)?: 0 days    On average, how many minutes do you engage in exercise at this level?: 0 min  Stress: No Stress Concern Present (09/06/2021)   Received from [facility] - Occupational Stress Questionnaire    Feeling of Stress : Not at all  Social Connections: Moderately Isolated (09/06/2021)   Received from [facility]   Social Connection and Isolation Panel    In a typical week, how many times do you talk on the phone with family, friends, or neighbors?: More than three times a week    How often do you get together with friends or relatives?: Three times a week    How often do you attend church or religious services?: More than 4 times per year    Do you belong to any clubs or organizations such as church groups, unions, fraternal or athletic groups, or school groups?: No    How often do you attend meetings of the clubs or organizations you belong to?: Never    Are you married, widowed, divorced, separated, never married, or living with a partner?: Widowed  Intimate Partner Violence: Not At Risk (09/06/2021)   Received from [facility]   Humiliation, Afraid, Rape, and Kick questionnaire    Within the last year, have you been afraid of your partner or ex-partner?: No    Within the last year, have you been humiliated or emotionally abused in other ways by your partner or ex-partner?: No    Within the last year, have you been kicked, hit, slapped, or otherwise physically hurt by your partner or ex-partner?: No    Within the last year, have you been raped or forced to have any kind of sexual activity by your partner or ex-partner?: No    Family History  Problem Relation Age of Onset   Stroke Mother    Diabetes Mother    Heart failure Father     Current Outpatient  Medications  Medication Sig Dispense Refill   ALPRAZolam (XANAX) 0.5 MG tablet Take 0.5 mg by mouth 2 (two) times daily.     aspirin  81 MG EC tablet Take 1 tablet (81 mg total) by mouth daily. Swallow whole. 30 tablet 11   atorvastatin  (LIPITOR ) 80 MG tablet Take 80 mg by mouth daily.     JARDIANCE 25 MG TABS tablet Take 25 mg by mouth daily.     lisinopril  (ZESTRIL ) 10 MG tablet Take 10 mg by mouth daily.     Menthol, Topical Analgesic, (BLUE-EMU MAXIMUM STRENGTH) 2.5 % LIQD Apply 1 application. topically daily as needed (pain).     omeprazole (PRILOSEC) 20 MG capsule Take 20 mg by mouth daily as needed (indigestion).  XARELTO  20 MG TABS tablet Take 1 tablet (20 mg total) by mouth daily with supper. Restart tomorrow 30 tablet    No current facility-administered medications for this visit.    No Known Allergies   REVIEW OF SYSTEMS:   [X]  denotes positive finding, [ ]  denotes negative finding Cardiac  Comments:  Chest pain or chest pressure:    Shortness of breath upon exertion:    Short of breath when lying flat:    Irregular heart rhythm:        Vascular    Pain in calf, thigh, or hip brought on by ambulation:    Pain in feet at night that wakes you up from your sleep:     Blood clot in your veins:    Leg swelling:         Pulmonary    Oxygen at home:    Productive cough:     Wheezing:         Neurologic    Sudden weakness in arms or legs:     Sudden numbness in arms or legs:     Sudden onset of difficulty speaking or slurred speech:    Temporary loss of vision in one eye:     Problems with dizziness:         Gastrointestinal    Blood in stool:     Vomited blood:         Genitourinary    Burning when urinating:     Blood in urine:        Psychiatric    Major depression:         Hematologic    Bleeding problems:    Problems with blood clotting too easily:        Skin    Rashes or ulcers:        Constitutional    Fever or chills:      PHYSICAL  EXAMINATION:  Vitals:   08/20/24 1441  BP: (!) 154/87  Pulse: 62  Resp: 18  Temp: 97.9 F (36.6 C)  TempSrc: Temporal  SpO2: 98%  Weight: 180 lb 4.8 oz (81.8 kg)  Height: 5' 9 (1.753 m)    General:  WDWN in NAD; vital signs documented above Gait: Not observed HENT: WNL, normocephalic Pulmonary: normal non-labored breathing , without Rales, rhonchi,  wheezing Cardiac: regular HR Abdomen: soft, NT, no masses Skin: without rashes Vascular Exam/Pulses:  Right Left  Radial 2+ (normal) 2+ (normal)  Ulnar 2+ (normal) 2+ (normal)  Femoral 2+ (normal) 2+ (normal)  Popliteal    DP 2+ 2+ (normal)  PT absent 1+ (weak)   Extremities: without ischemic changes, without Gangrene , without cellulitis; with right heal cracked from dry skin Musculoskeletal: no muscle wasting or atrophy  Neurologic: A&O X 3;  No focal weakness or paresthesias are detected Psychiatric:  The pt has Normal affect.   Non-Invasive Vascular Imaging:     +-----------+--------+-----+---------------+---------+--------+  RIGHT     PSV cm/sRatioStenosis       Waveform Comments  +-----------+--------+-----+---------------+---------+--------+  CFA Distal 107                         biphasic           +-----------+--------+-----+---------------+---------+--------+  DFA       133                         biphasic           +-----------+--------+-----+---------------+---------+--------+  SFA Prox   100                         biphasic           +-----------+--------+-----+---------------+---------+--------+  SFA Mid                                         stent     +-----------+--------+-----+---------------+---------+--------+  SFA Distal                                      stent     +-----------+--------+-----+---------------+---------+--------+  POP Prox   80                          biphasic            +-----------+--------+-----+---------------+---------+--------+  POP Distal 226          50-74% stenosistriphasic          +-----------+--------+-----+---------------+---------+--------+  TP Trunk   90                          triphasic          +-----------+--------+-----+---------------+---------+--------+  ATA Distal 95                          biphasic           +-----------+--------+-----+---------------+---------+--------+  PTA Distal              occluded                          +-----------+--------+-----+---------------+---------+--------+  PERO Distal                                     NWV       +-----------+--------+-----+---------------+---------+--------+    ASSESSMENT/PLAN: Demarquis Osley is a 62 y.o. male presenting in follow-up status post 07/26/2021 Right SFA stenting, popliteal artery, posterior tibial artery, peroneal artery, medial plantar artery balloon angioplasty for Rutherford 5 critical limb ischemia.  Most recently he underwent repeat right lower extremity angiography for Rutherford 5 critical limb ischemia 2023.  I was able to open the anterior tibial artery with balloon angioplasty and laser atherectomy however outflow was poor.   Since 2023, Velinda has continued to do well.  He stopped smoking, and walks on a regular basis.  He push mows his yard without issue.  On exam today, Velinda was doing well.  He has a palpable dorsalis pedis pulse in the right foot.  No changes in vascular studies.   Tim stated he would rather dies than lose his leg. Will continue 6 month follow up as he is high risk for limb loss.   He was asked to call my office should any questions or concerns arise as I would be happy to see him earlier.   Fonda FORBES Rim, MD [facility] [phone]

## 2024-08-24 ENCOUNTER — Other Ambulatory Visit: Payer: Self-pay

## 2024-08-24 DIAGNOSIS — I70211 Atherosclerosis of native arteries of extremities with intermittent claudication, right leg: Secondary | ICD-10-CM

## 2024-10-26 ENCOUNTER — Encounter: Payer: Self-pay | Admitting: *Deleted

## 2024-10-26 NOTE — Progress Notes (Signed)
 Daniel Byrd                                          MRN: [id number]   10/26/2024   The [facility] Quality Team Specialist reviewed this patient medical record for the purposes of chart review for care gap closure. The following were reviewed: chart review for care gap closure-glycemic status assessment and kidney health evaluation for diabetes:eGFR  and uACR.    [facility] Quality Team

## 2024-11-02 NOTE — Progress Notes (Signed)
 Daniel Byrd                                          MRN: [id number]   11/02/2024   The [facility] Quality Team Specialist reviewed this patient medical record for the purposes of chart review for care gap closure. The following were reviewed: chart review for care gap closure-glycemic status assessment.    [facility]I Quality Team

## 2024-11-27 ENCOUNTER — Encounter: Payer: Self-pay | Admitting: *Deleted

## 2024-11-27 NOTE — Progress Notes (Signed)
 Parley Pidcock                                          MRN: [id number]   11/27/2024   The [facility] Quality Team Specialist reviewed this patient medical record for the purposes of chart review for care gap closure. The following were reviewed: chart review for care gap closure-glycemic status assessment.    [facility] Quality Team

## 2025-02-25 ENCOUNTER — Ambulatory Visit: Admitting: Vascular Surgery

## 2025-02-25 ENCOUNTER — Ambulatory Visit (HOSPITAL_COMMUNITY)
